# Patient Record
Sex: Male | Born: 1960
Health system: Southern US, Community
[De-identification: ages and names within clinical notes are randomized; demographics above are authoritative.]

## PROBLEM LIST (undated history)

## (undated) DIAGNOSIS — E119 Type 2 diabetes mellitus without complications: Secondary | ICD-10-CM

## (undated) DIAGNOSIS — M21372 Foot drop, left foot: Secondary | ICD-10-CM

## (undated) DIAGNOSIS — G4730 Sleep apnea, unspecified: Secondary | ICD-10-CM

## (undated) DIAGNOSIS — M549 Dorsalgia, unspecified: Secondary | ICD-10-CM

## (undated) DIAGNOSIS — I509 Heart failure, unspecified: Secondary | ICD-10-CM

## (undated) DIAGNOSIS — C801 Malignant (primary) neoplasm, unspecified: Secondary | ICD-10-CM

## (undated) DIAGNOSIS — E785 Hyperlipidemia, unspecified: Secondary | ICD-10-CM

## (undated) HISTORY — DX: Malignant (primary) neoplasm, unspecified: C80.1

## (undated) HISTORY — DX: Hyperlipidemia, unspecified: E78.5

## (undated) HISTORY — DX: Sleep apnea, unspecified: G47.30

---

## 2016-12-31 ENCOUNTER — Emergency Department: Payer: BLUE CROSS/BLUE SHIELD

## 2016-12-31 ENCOUNTER — Emergency Department
Admission: EM | Admit: 2016-12-31 | Discharge: 2016-12-31 | Disposition: A | Discharge status: Home / Self Care | Payer: BLUE CROSS/BLUE SHIELD | Attending: Emergency Medicine | Admitting: Emergency Medicine

## 2016-12-31 DIAGNOSIS — N2889 Other specified disorders of kidney and ureter: Secondary | ICD-10-CM

## 2016-12-31 DIAGNOSIS — M545 Low back pain: Secondary | ICD-10-CM | POA: Diagnosis present

## 2016-12-31 DIAGNOSIS — M5442 Lumbago with sciatica, left side: Secondary | ICD-10-CM | POA: Insufficient documentation

## 2016-12-31 DIAGNOSIS — Z79899 Other long term (current) drug therapy: Secondary | ICD-10-CM | POA: Diagnosis not present

## 2016-12-31 DIAGNOSIS — I509 Heart failure, unspecified: Secondary | ICD-10-CM | POA: Insufficient documentation

## 2016-12-31 DIAGNOSIS — G8929 Other chronic pain: Secondary | ICD-10-CM

## 2016-12-31 DIAGNOSIS — Z794 Long term (current) use of insulin: Secondary | ICD-10-CM | POA: Insufficient documentation

## 2016-12-31 DIAGNOSIS — E119 Type 2 diabetes mellitus without complications: Secondary | ICD-10-CM | POA: Diagnosis not present

## 2016-12-31 DIAGNOSIS — M5410 Radiculopathy, site unspecified: Secondary | ICD-10-CM | POA: Diagnosis not present

## 2016-12-31 HISTORY — DX: Heart failure, unspecified: I50.9

## 2016-12-31 HISTORY — DX: Dorsalgia, unspecified: M54.9

## 2016-12-31 HISTORY — DX: Type 2 diabetes mellitus without complications: E11.9

## 2016-12-31 HISTORY — DX: Foot drop, left foot: M21.372

## 2016-12-31 LAB — COMPREHENSIVE METABOLIC PANEL
ALK PHOS: 101 U/L (ref 38–126)
ALT: 42 U/L (ref 17–63)
AST: 30 U/L (ref 15–41)
Albumin: 4.5 g/dL (ref 3.5–5.0)
Anion gap: 10 (ref 5–15)
BUN: 18 mg/dL (ref 6–20)
CALCIUM: 9.8 mg/dL (ref 8.9–10.3)
CO2: 24 mmol/L (ref 22–32)
CREATININE: 0.95 mg/dL (ref 0.61–1.24)
Chloride: 98 mmol/L — ABNORMAL LOW (ref 101–111)
Glucose, Bld: 469 mg/dL — ABNORMAL HIGH (ref 65–99)
Potassium: 4.5 mmol/L (ref 3.5–5.1)
Sodium: 132 mmol/L — ABNORMAL LOW (ref 135–145)
TOTAL PROTEIN: 8.3 g/dL — AB (ref 6.5–8.1)
Total Bilirubin: 0.8 mg/dL (ref 0.3–1.2)

## 2016-12-31 LAB — CBC
HCT: 44.4 % (ref 40.0–52.0)
Hemoglobin: 15.3 g/dL (ref 13.0–18.0)
MCH: 29.6 pg (ref 26.0–34.0)
MCHC: 34.3 g/dL (ref 32.0–36.0)
MCV: 86.1 fL (ref 80.0–100.0)
PLATELETS: 204 10*3/uL (ref 150–440)
RBC: 5.16 MIL/uL (ref 4.40–5.90)
RDW: 13.1 % (ref 11.5–14.5)
WBC: 8.6 10*3/uL (ref 3.8–10.6)

## 2016-12-31 MED ORDER — OXYCODONE-ACETAMINOPHEN 5-325 MG PO TABS
1.0000 | ORAL_TABLET | Freq: Once | ORAL | Status: AC
  Administered 2016-12-31: 1 via ORAL
  Filled 2016-12-31: qty 1

## 2016-12-31 MED ORDER — IBUPROFEN 600 MG PO TABS: 600.0000 mg | ORAL_TABLET | Freq: Four times a day (QID) | ORAL | 0 refills | Status: DC | PRN

## 2016-12-31 MED ORDER — HYDROCODONE-ACETAMINOPHEN 5-325 MG PO TABS: 1.0000 | ORAL_TABLET | Freq: Four times a day (QID) | ORAL | 0 refills | Status: DC | PRN

## 2016-12-31 MED ORDER — KETOROLAC TROMETHAMINE 30 MG/ML IJ SOLN
30.0000 mg | Freq: Once | INTRAMUSCULAR | Status: AC
  Administered 2016-12-31: 30 mg via INTRAVENOUS
  Filled 2016-12-31: qty 1

## 2016-12-31 MED ORDER — GADOBENATE DIMEGLUMINE 529 MG/ML IV SOLN
20.0000 mL | Freq: Once | INTRAVENOUS | Status: AC | PRN
  Administered 2016-12-31: 20 mL via INTRAVENOUS
  Filled 2016-12-31: qty 20

## 2016-12-31 NOTE — ED Triage Notes (Signed)
Lower back pain to center and left side. Hx of same and drop foot to left side. Pt took Vicodin PTA

## 2016-12-31 NOTE — ED Notes (Signed)
See triage note  Presents with lower back pain  States he has had back surgeries in past and takes vicodin for pain.  Pain is moving into left leg /foot  States he foot drop to same foot  States now he is having  a hard time rasing his foot  Only able to lift great toe

## 2016-12-31 NOTE — ED Provider Notes (Signed)
[facility] Emergency Department Provider Note  ____________________________________________  Time seen: Approximately 12:01 PM  I have reviewed the triage vital signs and the nursing notes.   HISTORY  Chief Complaint Back Pain   HPI Juan Adkins is a 56 y.o. male that presents to the emergency department for evaluation of low back pain for 1 week. Patient states that he was lifting a heavy toolbox one week ago and thought he pulled a muscle.Pain has remained constant throughout the week. Patient has chronic back pain and has had 4 back surgeries. Last back surgery was 3 years ago. Patient has had chronic foot drop after the last surgery but is usually able to move his great toe. Last night he noticed that he was unable to move his toe and is having some numbness in that toe. His previous back surgeon is in [place] and he has not seen him since 2015. No shortness of breath, chest pain, nausea, vomiting, abdominal pain, bowel or bladder dysfunction, saddle paresthesias.   Past Medical History:  Diagnosis Date  . Back pain   . CHF (congestive heart failure) ([place])   . Diabetes mellitus without complication ([place])   . Foot drop, left foot     There are no active problems to display for this patient.   Past Surgical History:  Procedure Laterality Date  . BACK SURGERY      Prior to Admission medications   Medication Sig Start Date End Date Taking? Authorizing Provider  atorvastatin (LIPITOR) 80 MG tablet Take 80 mg by mouth daily.   Yes [provider]  carvedilol (COREG) 6.25 MG tablet Take 6.25 mg by mouth 2 (two) times daily with a meal.   Yes [provider]  digoxin (LANOXIN) 0.125 MG tablet Take by mouth daily.   Yes [provider]  furosemide (LASIX) 20 MG tablet Take 20 mg by mouth.   Yes [provider]  glimepiride (AMARYL) 4 MG tablet Take 8 mg by mouth daily with breakfast.   Yes [provider]   insulin glargine (LANTUS) 100 UNIT/ML injection Inject into the skin at bedtime.   Yes [provider]  metFORMIN (GLUCOPHAGE) 1000 MG tablet Take 1,000 mg by mouth 2 (two) times daily with a meal.   Yes [provider]  HYDROcodone-acetaminophen (NORCO/VICODIN) 5-325 MG tablet Take 1 tablet by mouth every 6 (six) hours as needed for moderate pain. 12/31/16   [name], PA-C  ibuprofen (ADVIL,MOTRIN) 600 MG tablet Take 1 tablet (600 mg total) by mouth every 6 (six) hours as needed. 12/31/16   [name], PA-C    Allergies Penicillins  No family history on file.  Social History Social History  Substance Use Topics  . Smoking status: Never Smoker  . Smokeless tobacco: Not on file  . Alcohol use No     Review of Systems  Constitutional: No fever/chills Cardiovascular: No chest pain. Respiratory: No SOB. Gastrointestinal: No abdominal pain.  No nausea, no vomiting.  Musculoskeletal: Positive for back pain. Skin: Negative for rash, abrasions, lacerations, ecchymosis. Neurological: Negative for headaches   ____________________________________________   PHYSICAL EXAM:  VITAL SIGNS: ED Triage Vitals [12/31/16 1052]  Enc Vitals Group     BP 105/65     Pulse Rate 94     Resp 18     Temp (!) 97.4 F (36.3 C)     Temp Source Oral     SpO2 98 %     Weight 210 lb (95.3 kg)  Height 5\' 9"  (1.753 m)     Head Circumference      Peak Flow      Pain Score 8     Pain Loc      Pain Edu?      Excl. in [place]?      Constitutional: Alert and oriented. Well appearing and in no acute distress. Eyes: Conjunctivae are normal. PERRL. EOMI. Head: Atraumatic. ENT:      Ears:      Nose: No congestion/rhinnorhea.      Mouth/Throat: Mucous membranes are moist.  Neck: No stridor.   Cardiovascular: Normal rate, regular rhythm.  Good peripheral circulation. Palpable dorsalis pedis pulses. Respiratory: Normal respiratory effort without tachypnea or retractions.  Lungs CTAB. Good air entry to the bases with no decreased or absent breath sounds. Gastrointestinal: Bowel sounds 4 quadrants. Soft and nontender to palpation. No guarding or rigidity. No palpable masses. No distention.  Musculoskeletal: Full range of motion to all extremities. No gross deformities appreciated. Positive straight leg raise. Sensation intact of lower legs intact bilaterally. Foot drop of left foot. Unable to move left great toe.  Neurologic:  Normal speech and language. No gross focal neurologic deficits are appreciated.  Skin:  Skin is warm, dry and intact. No rash noted.  ____________________________________________   LABS (all labs ordered are listed, but only abnormal results are displayed)  Labs Reviewed  COMPREHENSIVE METABOLIC PANEL - Abnormal; Notable for the following:       Result Value   Sodium 132 (*)    Chloride 98 (*)    Glucose, Bld 469 (*)    Total Protein 8.3 (*)    All other components within normal limits  CBC   ____________________________________________  EKG   ____________________________________________  RADIOLOGY  Mr Lumbar Spine W Wo Contrast  Result Date: 12/31/2016 CLINICAL DATA:  Lower back pain. Back surgeries in the past. Left leg foot drop. EXAM: MRI LUMBAR SPINE WITHOUT AND WITH CONTRAST TECHNIQUE: Multiplanar and multiecho pulse sequences of the lumbar spine were obtained without and with intravenous contrast. CONTRAST:  59mL MULTIHANCE GADOBENATE DIMEGLUMINE 529 MG/ML IV SOLN COMPARISON:  None. FINDINGS: Segmentation:  Standard Alignment:  Normal Vertebrae:  No fracture, evidence of discitis, or bone lesion. Conus medullaris: Extends to the L1 level and appears normal. There is fat deposition within the filum, small volume and incidental. Paraspinal and other soft tissues: 18 mm enhancing partly exophytic mass from the interpolar left kidney, usually renal cell carcinoma. Disc levels: T12- L1: Mild disc bulging, mainly left  far-lateral.  No impingement L1-L2: Mild annulus bulging.  No impingement L2-L3: Disc bulging and small up turning central disc protrusion. No impingement L3-L4: Degenerative disc narrowing and bulging with a left paracentral down turning disc protrusion. Ventral thecal sac is flattened by disc bulge and there is left more than right L4 impingement in the subarticular recesses. Patent foramina. L4-L5: Status post laminotomy on the right with patent right canal; probable microdiscectomy. There is disc bulging with a central down turning disc protrusion. The left L5 nerve root is compressed in the subarticular recess. Patent foramina. L5-S1:Left-sided foraminotomy and partial facetectomy. There is expected mild scarring in this area without stenosis. The disc shows advanced degenerative narrowing with disc bulging and endplate spurring. Left foraminal stenosis and L5 impingement. Right foramen is patent with mild narrowing due to facet spurs. IMPRESSION: 1. 18 mm left renal mass primarily suspicious for renal cell carcinoma. Recommend urology referral and dedicated renal protocol CT or MRI. 2. L3-4  disc bulge and left paracentral protrusion with left more than right L4 impingement in the subarticular recesses. 3. L4-5 left L5 impingement in the subarticular recess. The canal and right recesses are patent after remote laminotomy. 4. L5-S1 left foraminal L5 impingement due to disc narrowing and endplate spurring. Previous left sided laminotomy with patent spinal canal. Electronically Signed   By: Monte Fantasia M.D.   On: 12/31/2016 14:45    ____________________________________________    PROCEDURES  Procedure(s) performed:    Procedures    Medications  oxyCODONE-acetaminophen (PERCOCET/ROXICET) 5-325 MG per tablet 1 tablet (1 tablet Oral Given 12/31/16 1219)  gadobenate dimeglumine (MULTIHANCE) injection 20 mL (20 mLs Intravenous Contrast Given 12/31/16 1404)  ketorolac (TORADOL) 30 MG/ML injection 30  mg (30 mg Intravenous Given 12/31/16 1736)     ____________________________________________   INITIAL IMPRESSION / ASSESSMENT AND PLAN / ED COURSE  Pertinent labs & imaging results that were available during my care of the patient were reviewed by me and considered in my medical decision making (see chart for details).  Review of the [place] CSRS was performed in accordance of the [place] prior to dispensing any controlled drugs.   Patient presented to the emergency department for evaluation of acute on chronic back pain. Vital signs and exam are reassuring. Patient has chronic foot drop of left foot and MRI was ordered due to new weakness in left great toe. No bowel or bladder dysfunction or saddle paresthesias. Dr. Cari Caraway was consulted, review MRI results, and recommended that patient follow up in clinic with him this week. Patient is aware of mass on kidneys and has been followed by urology previously regarding this. I will refer him to a urologist here. I will not give patient steroids given his high blood sugar. Patient does not check blood sugar and was educated on risks of blood sugars remaining this high. He was given a toradol injection.  Patient will be discharged home with prescriptions for ibuprofen and a very short course of vicodin. Patient is to follow up with neurosurgery, urology, and PCP as directed. Patient is given ED precautions to return to the ED for any worsening or new symptoms.     ____________________________________________  FINAL CLINICAL IMPRESSION(S) / ED DIAGNOSES  Final diagnoses:  Radiculopathy, unspecified spinal region  Chronic left-sided low back pain with left-sided sciatica  Renal mass      NEW MEDICATIONS STARTED DURING THIS VISIT:  Discharge Medication List as of 12/31/2016  5:32 PM    START taking these medications   Details  HYDROcodone-acetaminophen (NORCO/VICODIN) 5-325 MG tablet Take 1 tablet by mouth every 6 (six) hours as needed for  moderate pain., Starting Thu 12/31/2016, Print    ibuprofen (ADVIL,MOTRIN) 600 MG tablet Take 1 tablet (600 mg total) by mouth every 6 (six) hours as needed., Starting Thu 12/31/2016, Print            This chart was dictated using voice recognition software/Dragon. Despite best efforts to proofread, errors can occur which can change the meaning. Any change was purely unintentional.    [name], PA-C 01/01/17 1024    Harvest Dark, MD 01/03/17 [phone]

## 2017-01-11 ENCOUNTER — Encounter: Payer: Self-pay | Admitting: Student in an Organized Health Care Education/Training Program

## 2017-01-11 ENCOUNTER — Ambulatory Visit
Admission: RE | Admit: 2017-01-11 | Discharge: 2017-01-11 | Disposition: A | Discharge status: Home / Self Care | Payer: BLUE CROSS/BLUE SHIELD | Source: Ambulatory Visit | Attending: Student in an Organized Health Care Education/Training Program | Admitting: Student in an Organized Health Care Education/Training Program

## 2017-01-11 ENCOUNTER — Ambulatory Visit (HOSPITAL_BASED_OUTPATIENT_CLINIC_OR_DEPARTMENT_OTHER): Payer: BLUE CROSS/BLUE SHIELD | Admitting: Student in an Organized Health Care Education/Training Program

## 2017-01-11 VITALS — BP 131/83 | HR 88 | Temp 98.0°F | Resp 15 | Ht 69.0 in | Wt 207.0 lb

## 2017-01-11 DIAGNOSIS — Z87891 Personal history of nicotine dependence: Secondary | ICD-10-CM | POA: Diagnosis not present

## 2017-01-11 DIAGNOSIS — Z79899 Other long term (current) drug therapy: Secondary | ICD-10-CM | POA: Diagnosis not present

## 2017-01-11 DIAGNOSIS — E119 Type 2 diabetes mellitus without complications: Secondary | ICD-10-CM | POA: Diagnosis not present

## 2017-01-11 DIAGNOSIS — Z880 Allergy status to penicillin: Secondary | ICD-10-CM | POA: Diagnosis not present

## 2017-01-11 DIAGNOSIS — M79605 Pain in left leg: Secondary | ICD-10-CM | POA: Insufficient documentation

## 2017-01-11 DIAGNOSIS — M545 Low back pain, unspecified: Secondary | ICD-10-CM

## 2017-01-11 DIAGNOSIS — M21372 Foot drop, left foot: Secondary | ICD-10-CM | POA: Diagnosis not present

## 2017-01-11 DIAGNOSIS — M549 Dorsalgia, unspecified: Secondary | ICD-10-CM | POA: Insufficient documentation

## 2017-01-11 DIAGNOSIS — M5136 Other intervertebral disc degeneration, lumbar region: Secondary | ICD-10-CM

## 2017-01-11 DIAGNOSIS — M5416 Radiculopathy, lumbar region: Secondary | ICD-10-CM | POA: Diagnosis not present

## 2017-01-11 DIAGNOSIS — G4730 Sleep apnea, unspecified: Secondary | ICD-10-CM | POA: Diagnosis not present

## 2017-01-11 DIAGNOSIS — M5116 Intervertebral disc disorders with radiculopathy, lumbar region: Secondary | ICD-10-CM | POA: Insufficient documentation

## 2017-01-11 DIAGNOSIS — Z794 Long term (current) use of insulin: Secondary | ICD-10-CM | POA: Diagnosis not present

## 2017-01-11 DIAGNOSIS — I509 Heart failure, unspecified: Secondary | ICD-10-CM | POA: Insufficient documentation

## 2017-01-11 MED ORDER — LIDOCAINE HCL (PF) 1 % IJ SOLN
INTRAMUSCULAR | Status: AC
  Filled 2017-01-11: qty 5

## 2017-01-11 MED ORDER — IOPAMIDOL (ISOVUE-M 200) INJECTION 41%
10.0000 mL | Freq: Once | INTRAMUSCULAR | Status: AC
  Administered 2017-01-11: 10 mL via EPIDURAL

## 2017-01-11 MED ORDER — DEXAMETHASONE SODIUM PHOSPHATE 10 MG/ML IJ SOLN
10.0000 mg | Freq: Once | INTRAMUSCULAR | Status: AC
  Administered 2017-01-11: 10 mg

## 2017-01-11 MED ORDER — LIDOCAINE HCL (PF) 1 % IJ SOLN
10.0000 mL | Freq: Once | INTRAMUSCULAR | Status: AC
  Administered 2017-01-11: 5 mL

## 2017-01-11 MED ORDER — ROPIVACAINE HCL 2 MG/ML IJ SOLN
10.0000 mL | Freq: Once | INTRAMUSCULAR | Status: AC
  Administered 2017-01-11: 10 mL

## 2017-01-11 MED ORDER — DEXAMETHASONE SODIUM PHOSPHATE 10 MG/ML IJ SOLN
INTRAMUSCULAR | Status: AC
  Filled 2017-01-11: qty 1

## 2017-01-11 MED ORDER — ROPIVACAINE HCL 2 MG/ML IJ SOLN
INTRAMUSCULAR | Status: AC
  Filled 2017-01-11: qty 10

## 2017-01-11 MED ORDER — SODIUM CHLORIDE 0.9% FLUSH
1.0000 mL | Freq: Once | INTRAVENOUS | Status: AC
  Administered 2017-01-11: 10 mL

## 2017-01-11 MED ORDER — IOPAMIDOL (ISOVUE-M 200) INJECTION 41%
INTRAMUSCULAR | Status: AC
  Filled 2017-01-11: qty 10

## 2017-01-11 MED ORDER — SODIUM CHLORIDE 0.9 % IJ SOLN
INTRAMUSCULAR | Status: AC
  Filled 2017-01-11: qty 10

## 2017-01-11 NOTE — Progress Notes (Signed)
Patient's Name: Juan Adkins  MRN: [id number]  Referring Provider: Meade Maw, MD  DOB: 09/13/1960  PCP: System, Pcp Not In  DOS: 01/11/2017  Note by: Gillis Santa, MD  Service setting: Ambulatory outpatient  Specialty: Interventional Pain Management  Location: [facility] (AMB) Pain Management Facility    Patient type: New patient ("FAST-TRACK" Evaluation)   Warning: This referral option does not include the extensive pharmacological evaluation required for [place] to take over the patient's medication management. The "Fast-Track" system is designed to bypass the new patient referral waiting list, as well as the normal patient evaluation process, in order to provide a patient in distress with a timely pain management intervention. Because the system was not designed to unfairly get a patient into our pain practice ahead of those already waiting, certain restrictions apply. By requesting a "Fast-Track" consult, the referring physician has opted to continue managing the patient's medications in order to get interventional urgent care.  Primary Reason for Visit: Interventional Pain Management Treatment. CC: Back Pain (low radiates to left buttock) and Leg Pain (left groin, leg pain is posterior)   Procedure  HPI  Mr. Laakso is a 56 y.o. year old, male patient, who comes today for a  "Fast-Track" new patient evaluation, as requested by Meade Maw, MD. The patient has been made aware that this type of referral option is reserved for the Interventional Pain Management portion of our practice and completely excludes the option of medication management. His primarily concern today is the Back Pain (low radiates to left buttock) and Leg Pain (left groin, leg pain is posterior)  Pain Assessment: Location: Lower Back Radiating: left buttock, left leg, let groin Onset: 1 to 4 weeks ago Duration: Acute pain Quality: Stabbing, Dull, Aching Severity: 7 /10 (self-reported pain score)  Note: Reported  level is compatible with observation.                   When using our objective Pain Scale, any level above a 5/10 is said to belong in an emergency room, as it progressively worsens from a 6/10, which is described as severely limiting. Requiring emergency care not usually available at an outpatient pain management facility. Communication becomes difficult and requires great effort. Assistance to reach the emergency department may be required. Facial flushing and profuse sweating along with potentially dangerous increases in heart rate and blood pressure will be evident. Effect on ADL:   Timing: Constant Modifying factors: lying flat, positioning, medications, ice, heat  Onset and Duration: Sudden and Date of onset: 12/28/2016 Cause of pain: Unknown Severity: Getting worse Timing: During activity or exercise and After a period of immobility Aggravating Factors: Bending, Bowel movements, Kneeling, Lifiting, Prolonged sitting, Squatting, Stooping , Twisting and Walking uphill Alleviating Factors: Stretching, Cold packs, Hot packs, Lying down, Medications, Sleeping and Walking Associated Problems: Constipation, Night-time cramps, Numbness, Sweating, Tingling, Weakness, Pain that wakes patient up and Pain that does not allow patient to sleep Quality of Pain: Aching, Agonizing, Annoying, Intermittent, Deep, Disabling, Dreadful, Dull, Horrible, Nagging, Punishing, Sharp, Shooting, Stabbing, Throbbing, Tingling and Uncomfortable Previous Examinations or Tests: MRI scan Previous Treatments: Narcotic medications  The patient comes into the clinics today, referred to [place] for a lumbar epidural steroid injection secondary to lumbar radiculopathy. Patient has a history of a left sided L3, L4, L5 laminotomy many years ago. He was moving heavy boxes last week and noticed worsening low back pain with radiation to left leg. Lumbar MRI performed on September 13 showed L3/L4 disc bulge  with left paracentral protrusion,  left L4-L5 disc bulge with L5 impingement/L5/S1 left foraminal L5 impingement.  Had a discussion about her transforaminal versus caudal epidural steroid injection. Givens that patient's pain is multilevel and with his prior history of laminotomy, we'll start off with caudal epidural steroid injection. If this is not effective, we can consider repeating with a transforaminal epidural steroid injection. Patient in agreement with plan. Risks and benefits of procedure discussed. Patient not on any blood thinners.  Meds   Current Outpatient Prescriptions:  .  atorvastatin (LIPITOR) 80 MG tablet, Take 80 mg by mouth daily., Disp: , Rfl:  .  carvedilol (COREG) 6.25 MG tablet, Take 6.25 mg by mouth 2 (two) times daily with a meal., Disp: , Rfl:  .  digoxin (LANOXIN) 0.125 MG tablet, Take by mouth daily., Disp: , Rfl:  .  furosemide (LASIX) 20 MG tablet, Take 20 mg by mouth., Disp: , Rfl:  .  glimepiride (AMARYL) 4 MG tablet, Take 8 mg by mouth daily with breakfast., Disp: , Rfl:  .  HYDROcodone-acetaminophen (NORCO/VICODIN) 5-325 MG tablet, Take 1 tablet by mouth every 6 (six) hours as needed for moderate pain., Disp: 4 tablet, Rfl: 0 .  ibuprofen (ADVIL,MOTRIN) 600 MG tablet, Take 1 tablet (600 mg total) by mouth every 6 (six) hours as needed., Disp: 30 tablet, Rfl: 0 .  insulin glargine (LANTUS) 100 UNIT/ML injection, Inject into the skin at bedtime., Disp: , Rfl:  .  lisinopril (PRINIVIL,ZESTRIL) 20 MG tablet, Take 20 mg by mouth daily., Disp: , Rfl:  .  metFORMIN (GLUCOPHAGE) 1000 MG tablet, Take 1,000 mg by mouth 2 (two) times daily with a meal., Disp: , Rfl:  .  vitamin A 7500 UNIT capsule, Take 7,500 Units by mouth daily., Disp: , Rfl:   Imaging Review    Shoulder Imaging Results for orders placed during the hospital encounter of 12/31/16  MR Lumbar Spine W Wo Contrast   Narrative CLINICAL DATA:  Lower back pain. Back surgeries in the past. Left leg foot drop.  EXAM: MRI LUMBAR SPINE  WITHOUT AND WITH CONTRAST  TECHNIQUE: Multiplanar and multiecho pulse sequences of the lumbar spine were obtained without and with intravenous contrast.  CONTRAST:  88m MULTIHANCE GADOBENATE DIMEGLUMINE 529 MG/ML IV SOLN  COMPARISON:  None.  FINDINGS: Segmentation:  Standard  Alignment:  Normal  Vertebrae:  No fracture, evidence of discitis, or bone lesion.  Conus medullaris: Extends to the L1 level and appears normal. There is fat deposition within the filum, small volume and incidental.  Paraspinal and other soft tissues: 18 mm enhancing partly exophytic mass from the interpolar left kidney, usually renal cell carcinoma.  Disc levels:  T12- L1: Mild disc bulging, mainly left far-lateral.  No impingement  L1-L2: Mild annulus bulging.  No impingement  L2-L3: Disc bulging and small up turning central disc protrusion. No impingement  L3-L4: Degenerative disc narrowing and bulging with a left paracentral down turning disc protrusion. Ventral thecal sac is flattened by disc bulge and there is left more than right L4 impingement in the subarticular recesses. Patent foramina.  L4-L5: Status post laminotomy on the right with patent right canal; probable microdiscectomy. There is disc bulging with a central down turning disc protrusion. The left L5 nerve root is compressed in the subarticular recess. Patent foramina.  L5-S1:Left-sided foraminotomy and partial facetectomy. There is expected mild scarring in this area without stenosis. The disc shows advanced degenerative narrowing with disc bulging and endplate spurring. Left foraminal stenosis and L5  impingement. Right foramen is patent with mild narrowing due to facet spurs.  IMPRESSION: 1. 18 mm left renal mass primarily suspicious for renal cell carcinoma. Recommend urology referral and dedicated renal protocol CT or MRI. 2. L3-4 disc bulge and left paracentral protrusion with left more than right L4 impingement in  the subarticular recesses. 3. L4-5 left L5 impingement in the subarticular recess. The canal and right recesses are patent after remote laminotomy. 4. L5-S1 left foraminal L5 impingement due to disc narrowing and endplate spurring. Previous left sided laminotomy with patent spinal canal.   Electronically Signed   By: Monte Fantasia M.D.   On: 12/31/2016 14:45       Complexity Note: Imaging results reviewed. Results discussed using Layman's terms.               ROS  Cardiovascular History: Heart failure Pulmonary or Respiratory History: Snoring  Neurological History: No reported neurological signs or symptoms such as seizures, abnormal skin sensations, urinary and/or fecal incontinence, being born with an abnormal open spine and/or a tethered spinal cord Review of Past Neurological Studies: No results found for this or any previous visit. Psychological-Psychiatric History: No reported psychological or psychiatric signs or symptoms such as difficulty sleeping, anxiety, depression, delusions or hallucinations (schizophrenial), mood swings (bipolar disorders) or suicidal ideations or attempts Gastrointestinal History: No reported gastrointestinal signs or symptoms such as vomiting or evacuating blood, reflux, heartburn, alternating episodes of diarrhea and constipation, inflamed or scarred liver, or pancreas or irrregular and/or infrequent bowel movements Genitourinary History: No reported renal or genitourinary signs or symptoms such as difficulty voiding or producing urine, peeing blood, non-functioning kidney, kidney stones, difficulty emptying the bladder, difficulty controlling the flow of urine, or chronic kidney disease Hematological History: Weakness due to low blood hemoglobin or red blood cell count (Anemia) Endocrine History: High blood sugar requiring insulin (IDDM) Rheumatologic History: No reported rheumatological signs and symptoms such as fatigue, joint pain, tenderness,  swelling, redness, heat, stiffness, decreased range of motion, with or without associated rash Musculoskeletal History: Negative for myasthenia gravis, muscular dystrophy, multiple sclerosis or malignant hyperthermia Work History: Working full time  Allergies  Mr. Wynder is allergic to penicillins.  Laboratory Chemistry  Inflammation Markers (CRP: Acute Phase) (ESR: Chronic Phase) No results found for: CRP, ESRSEDRATE               Renal Function Markers Lab Results  Component Value Date   BUN 18 12/31/2016   CREATININE 0.95 12/31/2016   GFRAA >60 12/31/2016   GFRNONAA >60 12/31/2016                 Hepatic Function Markers Lab Results  Component Value Date   AST 30 12/31/2016   ALT 42 12/31/2016   ALBUMIN 4.5 12/31/2016   ALKPHOS 101 12/31/2016                 Electrolytes Lab Results  Component Value Date   NA 132 (L) 12/31/2016   K 4.5 12/31/2016   CL 98 (L) 12/31/2016   CALCIUM 9.8 12/31/2016                 Neuropathy Markers No results found for: [id number]               Bone Pathology Markers Lab Results  Component Value Date   ALKPHOS 101 12/31/2016   CALCIUM 9.8 12/31/2016                 Coagulation Parameters  Lab Results  Component Value Date   PLT 204 12/31/2016                 Cardiovascular Markers Lab Results  Component Value Date   HGB 15.3 12/31/2016   HCT 44.4 12/31/2016                 Note: Lab results reviewed.  [place]  Drug: Mr. Leeth  reports that he does not use drugs. Alcohol:  reports that he does not drink alcohol. Tobacco:  reports that he quit smoking about 3 years ago. His smoking use included Cigarettes. He has a 30.00 pack-year smoking history. He has never used smokeless tobacco. Medical:  has a past medical history of Back pain; CHF (congestive heart failure) ([place]); Diabetes mellitus without complication ([place]); Foot drop, left foot; and Sleep apnea. Family: family history includes Dementia in his mother;  Diabetes in his brother and sister; Early death in his paternal aunt and paternal uncle; Heart disease in his father.  Past Surgical History:  Procedure Laterality Date  . BACK SURGERY     Active Ambulatory Problems    Diagnosis Date Noted  . No Active Ambulatory Problems   Resolved Ambulatory Problems    Diagnosis Date Noted  . No Resolved Ambulatory Problems   Past Medical History:  Diagnosis Date  . Back pain   . CHF (congestive heart failure) ([place])   . Diabetes mellitus without complication ([place])   . Foot drop, left foot   . Sleep apnea    Constitutional Exam  General appearance: Well nourished, well developed, and well hydrated. In no apparent acute distress Vitals:   01/11/17 1058 01/11/17 1103 01/11/17 1108 01/11/17 1113  BP: 118/79 115/76 108/70 131/83  Pulse: 79 75 75 88  Resp: 13 15 15 15   Temp:      TempSrc:      SpO2: 97% 96% 97% 99%  Weight:      Height:       BMI Assessment: Estimated body mass index is 30.57 kg/m as calculated from the following:   Height as of this encounter: 5' 9"  (1.753 m).   Weight as of this encounter: 207 lb (93.9 kg).  BMI interpretation table: BMI level Category Range association with higher incidence of chronic pain  <18 kg/m2 Underweight   18.5-24.9 kg/m2 Ideal body weight   25-29.9 kg/m2 Overweight Increased incidence by 20%  30-34.9 kg/m2 Obese (Class I) Increased incidence by 68%  35-39.9 kg/m2 Severe obesity (Class II) Increased incidence by 136%  >40 kg/m2 Extreme obesity (Class III) Increased incidence by 254%   BMI Readings from Last 4 Encounters:  01/11/17 30.57 kg/m  12/31/16 31.01 kg/m   Wt Readings from Last 4 Encounters:  01/11/17 207 lb (93.9 kg)  12/31/16 210 lb (95.3 kg)  Psych/Mental status: Alert, oriented x 3 (person, place, & time)       Eyes: PERLA Respiratory: No evidence of acute respiratory distress  Lumbar Spine Area Exam  Skin & Axial Inspection: Well healed scar from previous spine  surgery detected Alignment: Symmetrical Functional ROM: Decreased ROM      Stability: No instability detected Muscle Tone/Strength: Functionally intact. No obvious neuro-muscular anomalies detected. Sensory (Neurological): Unimpaired Palpation: No palpable anomalies       Provocative Tests: Lumbar Hyperextension and rotation test: Positive bilaterally for facet joint pain. Lumbar Lateral bending test: Positive ipsilateral radicular pain, on the left. Positive for left-sided foraminal stenosis. Patrick's Maneuver: evaluation deferred today  Gait & Posture Assessment  Ambulation: Patient ambulates using a cane Gait: Antalgic Posture: WNL   Lower Extremity Exam    Side: Right lower extremity  Side: Left lower extremity  Skin & Extremity Inspection: Skin color, temperature, and hair growth are WNL. No peripheral edema or cyanosis. No masses, redness, swelling, asymmetry, or associated skin lesions. No contractures.  Skin & Extremity Inspection: Skin color, temperature, and hair growth are WNL. No peripheral edema or cyanosis. No masses, redness, swelling, asymmetry, or associated skin lesions. No contractures.  Functional ROM: Decreased ROM          Functional ROM: Decreased ROM          Muscle Tone/Strength: Functionally intact. No obvious neuro-muscular anomalies detected.  Muscle Tone/Strength: 4 out of 5 strength plantar flexion, dorsiflexion, knee flexion, knee extension.  Sensory (Neurological): Dermatomal pain pattern  Sensory (Neurological): Dermatomal pain pattern  Palpation: No palpable anomalies  Palpation: No palpable anomalies   +SLR test on left  Procedure  56 year old male with a past medical history of left L3-L5 laminotomy who presents with acute on chronic lumbosacral radiculopathy here for caudal epidural steroid injection. Positive straight leg raise test on left. Patient not on any blood thinners.

## 2017-01-11 NOTE — Progress Notes (Signed)
Safety precautions to be maintained throughout the outpatient stay will include: orient to surroundings, keep bed in low position, maintain call bell within reach at all times, provide assistance with transfer out of bed and ambulation.  

## 2017-01-11 NOTE — Progress Notes (Signed)
Patient's Name: Jamin Panther  MRN: [id number]  Referring Provider: Meade Maw, MD  DOB: October 25, 1960  PCP: System, Pcp Not In  DOS: 01/11/2017  Note by: Gillis Santa, MD  Service setting: Ambulatory outpatient  Specialty: Interventional Pain Management  Patient type: Established  Location: [facility] (AMB) Pain Management Facility  Visit type: Interventional Procedure   Primary Reason for Visit: Interventional Pain Management Treatment. CC: Back Pain (low radiates to left buttock) and Leg Pain (left groin, leg pain is posterior)  Procedure:  Anesthesia, Analgesia, Anxiolysis:  Type: Diagnostic Epidural Steroid Injection Region: Caudal Level: Sacrococcygeal   Laterality: Midline aiming at the left  Type: Local Anesthesia Local Anesthetic: Lidocaine 1% Route: Infiltration (Almond/IM) IV Access: Secured Sedation: Meaningful verbal contact was maintained at all times during the procedure  Indication(s): Analgesia and Anxiety  Indications: 1. Lumbar radiculopathy   2. Lumbar degenerative disc disease   3. Lumbar pain    Pain Score: Pre-procedure: 7 /10 Post-procedure: 0-No pain/10  Pre-op Assessment:  Mr. Roser is a 56 y.o. (year old), male patient, seen today for interventional treatment. He  has a past surgical history that includes Back surgery. Mr. Crum has a current medication list which includes the following prescription(s): atorvastatin, carvedilol, digoxin, furosemide, glimepiride, hydrocodone-acetaminophen, ibuprofen, insulin glargine, lisinopril, metformin, and vitamin a. His primarily concern today is the Back Pain (low radiates to left buttock) and Leg Pain (left groin, leg pain is posterior)  Initial Vital Signs: There were no vitals taken for this visit. BMI: Estimated body mass index is 30.57 kg/m as calculated from the following:   Height as of this encounter: 5\' 9"  (1.753 m).   Weight as of this encounter: 207 lb (93.9 kg).  Risk Assessment: Allergies: Reviewed.  He is allergic to penicillins.  Allergy Precautions: None required Coagulopathies: Reviewed. None identified.  Blood-thinner therapy: None at this time Active Infection(s): Reviewed. None identified. Mr. Reish is afebrile  Site Confirmation: Mr. Yandow was asked to confirm the procedure and laterality before marking the site Procedure checklist: Completed Consent: Before the procedure and under the influence of no sedative(s), amnesic(s), or anxiolytics, the patient was informed of the treatment options, risks and possible complications. To fulfill our ethical and legal obligations, as recommended by the American Medical Association's Code of Ethics, I have informed the patient of my clinical impression; the nature and purpose of the treatment or procedure; the risks, benefits, and possible complications of the intervention; the alternatives, including doing nothing; the risk(s) and benefit(s) of the alternative treatment(s) or procedure(s); and the risk(s) and benefit(s) of doing nothing. The patient was provided information about the general risks and possible complications associated with the procedure. These may include, but are not limited to: failure to achieve desired goals, infection, bleeding, organ or nerve damage, allergic reactions, paralysis, and death. In addition, the patient was informed of those risks and complications associated to Spine-related procedures, such as failure to decrease pain; infection (i.e.: Meningitis, epidural or intraspinal abscess); bleeding (i.e.: epidural hematoma, subarachnoid hemorrhage, or any other type of intraspinal or peri-dural bleeding); organ or nerve damage (i.e.: Any type of peripheral nerve, nerve root, or spinal cord injury) with subsequent damage to sensory, motor, and/or autonomic systems, resulting in permanent pain, numbness, and/or weakness of one or several areas of the body; allergic reactions; (i.e.: anaphylactic reaction); and/or  death. Furthermore, the patient was informed of those risks and complications associated with the medications. These include, but are not limited to: allergic reactions (i.e.: anaphylactic or anaphylactoid reaction(s));  adrenal axis suppression; blood sugar elevation that in diabetics may result in ketoacidosis or comma; water retention that in patients with history of congestive heart failure may result in shortness of breath, pulmonary edema, and decompensation with resultant heart failure; weight gain; swelling or edema; medication-induced neural toxicity; particulate matter embolism and blood vessel occlusion with resultant organ, and/or nervous system infarction; and/or aseptic necrosis of one or more joints. Finally, the patient was informed that Medicine is not an exact science; therefore, there is also the possibility of unforeseen or unpredictable risks and/or possible complications that may result in a catastrophic outcome. The patient indicated having understood very clearly. We have given the patient no guarantees and we have made no promises. Enough time was given to the patient to ask questions, all of which were answered to the patient's satisfaction. Mr. Jepsen has indicated that he wanted to continue with the procedure. Attestation: I, the ordering provider, attest that I have discussed with the patient the benefits, risks, side-effects, alternatives, likelihood of achieving goals, and potential problems during recovery for the procedure that I have provided informed consent. Date: 01/11/2017; Time: 10:03 AM  Pre-Procedure Preparation:  Monitoring: As per clinic protocol. Respiration, ETCO2, SpO2, BP, heart rate and rhythm monitor placed and checked for adequate function Safety Precautions: Patient was assessed for positional comfort and pressure points before starting the procedure. Time-out: I initiated and conducted the "Time-out" before starting the procedure, as per protocol. The  patient was asked to participate by confirming the accuracy of the "Time Out" information. Verification of the correct person, site, and procedure were performed and confirmed by me, the nursing staff, and the patient. "Time-out" conducted as per Joint Commission's Universal Protocol (UP.01.01.01). "Time-out" Date & Time: 01/11/2017; 1055 hrs.  Description of Procedure Process:   Position: Prone Target Area: Caudal Epidural Canal. Approach: Midline approach. Area Prepped: Entire Posterior Sacrococcygeal Region Prepping solution: ChloraPrep (2% chlorhexidine gluconate and 70% isopropyl alcohol) Safety Precautions: Aspiration looking for blood return was conducted prior to all injections. At no point did we inject any substances, as a needle was being advanced. No attempts were made at seeking any paresthesias. Safe injection practices and needle disposal techniques used. Medications properly checked for expiration dates. SDV (single dose vial) medications used. Description of the Procedure: Protocol guidelines were followed. The patient was placed in position over the fluoroscopy table. The target area was identified and the area prepped in the usual manner. Skin desensitized using vapocoolant spray. Skin & deeper tissues infiltrated with local anesthetic. Appropriate amount of time allowed to pass for local anesthetics to take effect. The procedure needles were then advanced to the target area. Proper needle placement secured. Negative aspiration confirmed. Solution injected in intermittent fashion, asking for systemic symptoms every 0.5cc of injectate. The needles were then removed and the area cleansed, making sure to leave some of the prepping solution back to take advantage of its long term bactericidal properties. Vitals:   01/11/17 1058 01/11/17 1103 01/11/17 1108 01/11/17 1113  BP: 118/79 115/76 108/70 131/83  Pulse: 79 75 75 88  Resp: 13 15 15 15   Temp:      TempSrc:      SpO2: 97% 96% 97%  99%  Weight:      Height:        Start Time: 1056 hrs. End Time: 1110 hrs. Materials:  Needle(s) Type: Epidural needle Gauge: 17G Length: 3.5-in Medication(s): We administered lidocaine (PF), ropivacaine (PF) 2 mg/mL (0.2%), iopamidol, dexamethasone, and sodium chloride flush.  Please see chart orders for dosing details. 6 cc of normal saline, 2 cc of 0.2% ropivacaine, 1 cc of Decadron equals 9 cc total Imaging Guidance (Spinal):  Type of Imaging Technique: Fluoroscopy Guidance (Spinal) Indication(s): Assistance in needle guidance and placement for procedures requiring needle placement in or near specific anatomical locations not easily accessible without such assistance. Exposure Time: Please see nurses notes. Contrast: Before injecting any contrast, we confirmed that the patient did not have an allergy to iodine, shellfish, or radiological contrast. Once satisfactory needle placement was completed at the desired level, radiological contrast was injected. Contrast injected under live fluoroscopy. No contrast complications. See chart for type and volume of contrast used. Fluoroscopic Guidance: I was personally present during the use of fluoroscopy. "Tunnel Vision Technique" used to obtain the best possible view of the target area. Parallax error corrected before commencing the procedure. "Direction-depth-direction" technique used to introduce the needle under continuous pulsed fluoroscopy. Once target was reached, antero-posterior, oblique, and lateral fluoroscopic projection used confirm needle placement in all planes. Images permanently stored in EMR. Interpretation: I personally interpreted the imaging intraoperatively. Adequate needle placement confirmed in multiple planes. Appropriate spread of contrast into desired area was observed. No evidence of afferent or efferent intravascular uptake. No intrathecal or subarachnoid spread observed. Permanent images saved into the patient's  record.  Antibiotic Prophylaxis:  Indication(s): None identified Antibiotic given: None  Post-operative Assessment:  EBL: None Complications: No immediate post-treatment complications observed by team, or reported by patient. Note: The patient tolerated the entire procedure well. A repeat set of vitals were taken after the procedure and the patient was kept under observation following institutional policy, for this type of procedure. Post-procedural neurological assessment was performed, showing return to baseline, prior to discharge. The patient was provided with post-procedure discharge instructions, including a section on how to identify potential problems. Should any problems arise concerning this procedure, the patient was given instructions to immediately contact us, at any time, without hesitation. In any case, we plan to contact the patient by telephone for a follow-up status report regarding this interventional procedure. Comments:  No additional relevant information.  Plan of Care  Disposition: Discharge home  Discharge Date & Time: 01/11/2017; 1115 hrs.   Physical exam unchanged from prior. Patient noticing pain relief of left leg pain. Follow-up in 2 weeks.  Can repeat caudal ESI at that time or consider left L3-L4/L4-L5 transforaminal ESI  Physician-requested Follow-up:  Return in about 2 weeks (around 01/25/2017) for Post Procedure Evaluation.  Future Appointments Date Time Provider [place]  01/26/2017 9:45 AM Gillis Santa, MD [facility] None    Imaging Orders     DG C-Arm 1-60 Min-No Report Procedure Orders    No procedure(s) ordered today    Medications ordered for procedure: Meds ordered this encounter  Medications  . lidocaine (PF) (XYLOCAINE) 1 % injection 10 mL  . ropivacaine (PF) 2 mg/mL (0.2%) (NAROPIN) injection 10 mL  . iopamidol (ISOVUE-M) 41 % intrathecal injection 10 mL  . dexamethasone (DECADRON) injection 10 mg  . sodium chloride flush (NS)  0.9 % injection 1 mL   Medications administered: We administered lidocaine (PF), ropivacaine (PF) 2 mg/mL (0.2%), iopamidol, dexamethasone, and sodium chloride flush.  See the medical record for exact dosing, route, and time of administration.  New Prescriptions   No medications on file   Primary Care Physician: System, Pcp Not In Location: [facility] Note by: Gillis Santa, MD Date: 01/11/2017; Time: 12:11 PM  Disclaimer:  Medicine is not an Chief Strategy Officer. The only guarantee in medicine is that nothing is guaranteed. It is important to note that the decision to proceed with this intervention was based on the information collected from the patient. The Data and conclusions were drawn from the patient's questionnaire, the interview, and the physical examination. Because the information was provided in large part by the patient, it cannot be guaranteed that it has not been purposely or unconsciously manipulated. Every effort has been made to obtain as much relevant data as possible for this evaluation. It is important to note that the conclusions that lead to this procedure are derived in large part from the available data. Always take into account that the treatment will also be dependent on availability of resources and existing treatment guidelines, considered by other Pain Management Practitioners as being common knowledge and practice, at the time of the intervention. For Medico-Legal purposes, it is also important to point out that variation in procedural techniques and pharmacological choices are the acceptable norm. The indications, contraindications, technique, and results of the above procedure should only be interpreted and judged by a Board-Certified Interventional Pain Specialist with extensive familiarity and expertise in the same exact procedure and technique.

## 2017-01-11 NOTE — Patient Instructions (Signed)

## 2017-01-12 ENCOUNTER — Telehealth: Payer: Self-pay | Admitting: *Deleted

## 2017-01-12 NOTE — Telephone Encounter (Signed)
No problems post procedure. 

## 2017-01-18 DIAGNOSIS — I420 Dilated cardiomyopathy: Secondary | ICD-10-CM | POA: Insufficient documentation

## 2017-01-26 ENCOUNTER — Ambulatory Visit
Payer: 59 | Attending: Student in an Organized Health Care Education/Training Program | Admitting: Student in an Organized Health Care Education/Training Program

## 2017-01-26 ENCOUNTER — Encounter: Payer: Self-pay | Admitting: Student in an Organized Health Care Education/Training Program

## 2017-01-26 VITALS — BP 126/74 | HR 91 | Temp 98.0°F | Resp 16 | Ht 69.0 in | Wt 207.0 lb

## 2017-01-26 DIAGNOSIS — M5136 Other intervertebral disc degeneration, lumbar region: Secondary | ICD-10-CM

## 2017-01-26 DIAGNOSIS — G8929 Other chronic pain: Secondary | ICD-10-CM | POA: Insufficient documentation

## 2017-01-26 DIAGNOSIS — Z79899 Other long term (current) drug therapy: Secondary | ICD-10-CM | POA: Diagnosis not present

## 2017-01-26 DIAGNOSIS — G4730 Sleep apnea, unspecified: Secondary | ICD-10-CM | POA: Diagnosis not present

## 2017-01-26 DIAGNOSIS — M5116 Intervertebral disc disorders with radiculopathy, lumbar region: Secondary | ICD-10-CM | POA: Diagnosis not present

## 2017-01-26 DIAGNOSIS — M545 Low back pain, unspecified: Secondary | ICD-10-CM

## 2017-01-26 DIAGNOSIS — M961 Postlaminectomy syndrome, not elsewhere classified: Secondary | ICD-10-CM

## 2017-01-26 DIAGNOSIS — M21372 Foot drop, left foot: Secondary | ICD-10-CM | POA: Diagnosis not present

## 2017-01-26 DIAGNOSIS — Z794 Long term (current) use of insulin: Secondary | ICD-10-CM | POA: Insufficient documentation

## 2017-01-26 DIAGNOSIS — Z880 Allergy status to penicillin: Secondary | ICD-10-CM | POA: Diagnosis not present

## 2017-01-26 DIAGNOSIS — I509 Heart failure, unspecified: Secondary | ICD-10-CM | POA: Insufficient documentation

## 2017-01-26 DIAGNOSIS — M5416 Radiculopathy, lumbar region: Secondary | ICD-10-CM

## 2017-01-26 DIAGNOSIS — E119 Type 2 diabetes mellitus without complications: Secondary | ICD-10-CM | POA: Insufficient documentation

## 2017-01-26 DIAGNOSIS — K6289 Other specified diseases of anus and rectum: Secondary | ICD-10-CM | POA: Insufficient documentation

## 2017-01-26 DIAGNOSIS — Z87891 Personal history of nicotine dependence: Secondary | ICD-10-CM | POA: Insufficient documentation

## 2017-01-26 NOTE — Patient Instructions (Signed)
-  Repeat caudal epidural steroid injection. -Please review informational materials about spinal cord stimulation. Please bring questions with you to next visit. If you would like to proceed with spinal cord stimulation, we will have to obtain thoracic MRI as well as psychological evaluation.

## 2017-01-26 NOTE — Progress Notes (Signed)
Patient's Name: Juan Adkins  MRN: [id number]  Referring Provider: No ref. provider found  DOB: 12-23-1960  PCP: System, Pcp Not In  DOS: 01/26/2017  Note by: Gillis Santa, MD  Service setting: Ambulatory outpatient  Specialty: Interventional Pain Management  Location: [facility] (AMB) Pain Management Facility    Patient type: Established   Primary Reason(s) for Visit: Encounter for post-procedure evaluation of chronic illness with mild to moderate exacerbation CC: Rectal Pain (left buttock)  HPI  Juan Adkins is a 56 y.o. year old, male patient, who comes today for a post-procedure evaluation. He  does not have a problem list on file. His primarily concern today is the Rectal Pain (left buttock)  Pain Assessment: Location: Left Buttocks Radiating: radiates into left groin and left leg Onset: More than a month ago Duration: Chronic pain Quality: Stabbing, Aching, Dull Severity: 6 /10 (self-reported pain score)  Note: Reported level is compatible with observation.                   When using our objective Pain Scale, levels between 6 and 10/10 are said to belong in an emergency room, as it progressively worsens from a 6/10, described as severely limiting, requiring emergency care not usually available at an outpatient pain management facility. At a 6/10 level, communication becomes difficult and requires great effort. Assistance to reach the emergency department may be required. Facial flushing and profuse sweating along with potentially dangerous increases in heart rate and blood pressure will be evident. Effect on ADL:   Timing: Intermittent Modifying factors: walking, standing  Juan Adkins comes in today for post-procedure evaluation after the treatment done on 01/11/2017.  56 year old male with a past medical history of left L3-L5 laminotomy who presents with acute on chronic lumbosacral radiculopathy s/p caudal ESI on 01/11/17 who presents for follow up.  Further details on both, my  assessment(s), as well as the proposed treatment plan, please see below.  Post-Procedure Assessment  01/11/2017 Procedure: left caudal ESI 01/11/17 Pre-procedure pain score:  7/10 Post-procedure pain score: 0/10         Influential Factors: BMI: 30.57 kg/m Intra-procedural challenges: None observed.         Assessment challenges: None detected.              Reported side-effects: None.        Post-procedural adverse reactions or complications: None reported         Sedation: Please see nurses note. When no sedatives are used, the analgesic levels obtained are directly associated to the effectiveness of the local anesthetics. However, when sedation is provided, the level of analgesia obtained during the initial 1 hour following the intervention, is believed to be the result of a combination of factors. These factors may include, but are not limited to: 1. The effectiveness of the local anesthetics used. 2. The effects of the analgesic(s) and/or anxiolytic(s) used. 3. The degree of discomfort experienced by the patient at the time of the procedure. 4. The patients ability and reliability in recalling and recording the events. 5. The presence and influence of possible secondary gains and/or psychosocial factors. Reported result: Relief experienced during the 1st hour after the procedure: 100 % (Ultra-Short Term Relief)            Interpretative annotation: Clinically appropriate result. Analgesia during this period is likely to be Local Anesthetic and/or IV Sedative (Analgesic/Anxiolytic) related.          Effects of local anesthetic: The analgesic effects attained  during this period are directly associated to the localized infiltration of local anesthetics and therefore cary significant diagnostic value as to the etiological location, or anatomical origin, of the pain. Expected duration of relief is directly dependent on the pharmacodynamics of the local anesthetic used. Long-acting (4-6 hours)  anesthetics used.  Reported result: Relief during the next 4 to 6 hour after the procedure: 100 % (Short-Term Relief)            Interpretative annotation: Clinically appropriate result. Analgesia during this period is likely to be Local Anesthetic-related.          Long-term benefit: Defined as the period of time past the expected duration of local anesthetics (1 hour for short-acting and 4-6 hours for long-acting). With the possible exception of prolonged sympathetic blockade from the local anesthetics, benefits during this period are typically attributed to, or associated with, other factors such as analgesic sensory neuropraxia, antiinflammatory effects, or beneficial biochemical changes provided by agents other than the local anesthetics.  Reported result: Extended relief following procedure: 40 % (ongoing) (Long-Term Relief)            Interpretative annotation: Clinically appropriate result. Good relief. Incomplete therapeutic success. Inflammation plays a part in the etiology to the pain.          Current benefits: Defined as reported results that persistent at this point in time.   Analgesia: 25-50 %            Function: Somewhat improved ROM: Somewhat improved Interpretative annotation: Recurrence of symptoms. No permanent benefit expected. Results would suggest further treatment needed.          Interpretation: Results would suggest therapy to have a positive impact on the patient's condition.                  Plan:  Please see "Plan of Care" for details.       Laboratory Chemistry  Inflammation Markers (CRP: Acute Phase) (ESR: Chronic Phase) No results found for: CRP, ESRSEDRATE               Renal Function Markers Lab Results  Component Value Date   BUN 18 12/31/2016   CREATININE 0.95 12/31/2016   GFRAA >60 12/31/2016   GFRNONAA >60 12/31/2016                 Hepatic Function Markers Lab Results  Component Value Date   AST 30 12/31/2016   ALT 42 12/31/2016   ALBUMIN  4.5 12/31/2016   ALKPHOS 101 12/31/2016                 Electrolytes Lab Results  Component Value Date   NA 132 (L) 12/31/2016   K 4.5 12/31/2016   CL 98 (L) 12/31/2016   CALCIUM 9.8 12/31/2016                 Neuropathy Markers No results found for: [id number]               Bone Pathology Markers Lab Results  Component Value Date   ALKPHOS 101 12/31/2016   CALCIUM 9.8 12/31/2016                 Coagulation Parameters Lab Results  Component Value Date   PLT 204 12/31/2016                 Cardiovascular Markers Lab Results  Component Value Date   HGB 15.3 12/31/2016   HCT 44.4 12/31/2016  Note: Lab results reviewed.  Recent Diagnostic Imaging Results  DG C-Arm 1-60 Min-No Report Fluoroscopy was utilized by the requesting physician.  No radiographic  interpretation.   Complexity Note: Imaging results reviewed. Results shared with Mr. Senne, using Layman's terms.                         Meds   Current Outpatient Prescriptions:  .  atorvastatin (LIPITOR) 80 MG tablet, Take 80 mg by mouth daily., Disp: , Rfl:  .  carvedilol (COREG) 6.25 MG tablet, Take 6.25 mg by mouth 2 (two) times daily with a meal., Disp: , Rfl:  .  cholecalciferol (VITAMIN D) 1000 units tablet, Take 1,000 Units by mouth daily., Disp: , Rfl:  .  digoxin (LANOXIN) 0.125 MG tablet, Take by mouth daily., Disp: , Rfl:  .  furosemide (LASIX) 20 MG tablet, Take 20 mg by mouth., Disp: , Rfl:  .  glimepiride (AMARYL) 4 MG tablet, Take 4 mg by mouth 2 (two) times daily. , Disp: , Rfl:  .  HYDROcodone-acetaminophen (NORCO/VICODIN) 5-325 MG tablet, Take 1 tablet by mouth every 6 (six) hours as needed for moderate pain., Disp: 4 tablet, Rfl: 0 .  insulin glargine (LANTUS) 100 UNIT/ML injection, Inject 20 Units into the skin at bedtime. , Disp: , Rfl:  .  lisinopril (PRINIVIL,ZESTRIL) 20 MG tablet, Take 20 mg by mouth daily., Disp: , Rfl:  .  metFORMIN (GLUCOPHAGE) 1000 MG tablet, Take  1,000 mg by mouth 2 (two) times daily with a meal., Disp: , Rfl:  .  Omega-3 Fatty Acids (FISH OIL) 1000 MG CAPS, Take 1 capsule by mouth daily., Disp: , Rfl:  .  atorvastatin (LIPITOR) 80 MG tablet, Take by mouth., Disp: , Rfl:  .  carvedilol (COREG) 6.25 MG tablet, Take by mouth., Disp: , Rfl:  .  digoxin (LANOXIN) 0.125 MG tablet, Take by mouth., Disp: , Rfl:  .  furosemide (LASIX) 20 MG tablet, Take by mouth., Disp: , Rfl:  .  glimepiride (AMARYL) 4 MG tablet, Take by mouth., Disp: , Rfl:  .  HYDROcodone-acetaminophen (NORCO/VICODIN) 5-325 MG tablet, Take by mouth., Disp: , Rfl:  .  ibuprofen (ADVIL,MOTRIN) 600 MG tablet, Take 1 tablet (600 mg total) by mouth every 6 (six) hours as needed. (Patient not taking: Reported on 01/26/2017), Disp: 30 tablet, Rfl: 0 .  lisinopril (PRINIVIL,ZESTRIL) 20 MG tablet, Take by mouth., Disp: , Rfl:  .  metFORMIN (GLUCOPHAGE) 1000 MG tablet, Take by mouth., Disp: , Rfl:  .  vitamin A 7500 UNIT capsule, Take 7,500 Units by mouth daily., Disp: , Rfl:   ROS  Constitutional: Denies any fever or chills Gastrointestinal: No reported hemesis, hematochezia, vomiting, or acute GI distress Musculoskeletal: Denies any acute onset joint swelling, redness, loss of ROM, or weakness Neurological: No reported episodes of acute onset apraxia, aphasia, dysarthria, agnosia, amnesia, paralysis, loss of coordination, or loss of consciousness  Allergies  Mr. Klausing is allergic to penicillins.  [place]  Drug: Mr. Rolfe  reports that he does not use drugs. Alcohol:  reports that he does not drink alcohol. Tobacco:  reports that he quit smoking about 3 years ago. His smoking use included Cigarettes. He has a 30.00 pack-year smoking history. He has never used smokeless tobacco. Medical:  has a past medical history of Back pain; CHF (congestive heart failure) ([place]); Diabetes mellitus without complication ([place]); Foot drop, left foot; and Sleep apnea. Surgical: Mr. Grismer  has  a past  surgical history that includes Back surgery. Family: family history includes Dementia in his mother; Diabetes in his brother and sister; Early death in his paternal aunt and paternal uncle; Heart disease in his father.  Constitutional Exam  General appearance: Well nourished, well developed, and well hydrated. In no apparent acute distress Vitals:   01/26/17 1003  BP: 126/74  Pulse: 91  Resp: 16  Temp: 98 F (36.7 C)  SpO2: 98%  Weight: 207 lb (93.9 kg)  Height: 5' 9"  (1.753 m)   BMI Assessment: Estimated body mass index is 30.57 kg/m as calculated from the following:   Height as of this encounter: 5' 9"  (1.753 m).   Weight as of this encounter: 207 lb (93.9 kg).  BMI interpretation table: BMI level Category Range association with higher incidence of chronic pain  <18 kg/m2 Underweight   18.5-24.9 kg/m2 Ideal body weight   25-29.9 kg/m2 Overweight Increased incidence by 20%  30-34.9 kg/m2 Obese (Class I) Increased incidence by 68%  35-39.9 kg/m2 Severe obesity (Class II) Increased incidence by 136%  >40 kg/m2 Extreme obesity (Class III) Increased incidence by 254%   BMI Readings from Last 4 Encounters:  01/26/17 30.57 kg/m  01/11/17 30.57 kg/m  12/31/16 31.01 kg/m   Wt Readings from Last 4 Encounters:  01/26/17 207 lb (93.9 kg)  01/11/17 207 lb (93.9 kg)  12/31/16 210 lb (95.3 kg)  Psych/Mental status: Alert, oriented x 3 (person, place, & time)       Eyes: PERLA Respiratory: No evidence of acute respiratory distress  Cervical Spine Area Exam  Skin & Axial Inspection: No masses, redness, edema, swelling, or associated skin lesions Alignment: Symmetrical Functional ROM: Unrestricted ROM      Stability: No instability detected Muscle Tone/Strength: Functionally intact. No obvious neuro-muscular anomalies detected. Sensory (Neurological): Unimpaired Palpation: No palpable anomalies              Upper Extremity (UE) Exam    Side: Right upper extremity  Side:  Left upper extremity  Skin & Extremity Inspection: Skin color, temperature, and hair growth are WNL. No peripheral edema or cyanosis. No masses, redness, swelling, asymmetry, or associated skin lesions. No contractures.  Skin & Extremity Inspection: Skin color, temperature, and hair growth are WNL. No peripheral edema or cyanosis. No masses, redness, swelling, asymmetry, or associated skin lesions. No contractures.  Functional ROM: Unrestricted ROM          Functional ROM: Unrestricted ROM          Muscle Tone/Strength: Functionally intact. No obvious neuro-muscular anomalies detected.  Muscle Tone/Strength: Functionally intact. No obvious neuro-muscular anomalies detected.  Sensory (Neurological): Unimpaired          Sensory (Neurological): Unimpaired          Palpation: No palpable anomalies              Palpation: No palpable anomalies              Specialized Test(s): Deferred         Specialized Test(s): Deferred          Thoracic Spine Area Exam  Skin & Axial Inspection: No masses, redness, or swelling Alignment: Symmetrical Functional ROM: Unrestricted ROM Stability: No instability detected Muscle Tone/Strength: Functionally intact. No obvious neuro-muscular anomalies detected. Sensory (Neurological): Unimpaired Muscle strength & Tone: No palpable anomalies  Lumbar Spine Area Exam  Skin & Axial Inspection: No masses, redness, or swelling Alignment: Symmetrical Functional ROM: Unrestricted ROM      Stability:  No instability detected Muscle Tone/Strength: Functionally intact. No obvious neuro-muscular anomalies detected. Sensory (Neurological): Unimpaired Palpation: No palpable anomalies       Provocative Tests: Lumbar Hyperextension and rotation test: Improved after treatment       Lumbar Lateral bending test: Improved after treatment       Patrick's Maneuver: evaluation deferred today                    Gait & Posture Assessment  Ambulation: Unassisted Gait: Relatively normal  for age and body habitus Posture: WNL   Lower Extremity Exam    Side: Right lower extremity  Side: Left lower extremity  Skin & Extremity Inspection: Skin color, temperature, and hair growth are WNL. No peripheral edema or cyanosis. No masses, redness, swelling, asymmetry, or associated skin lesions. No contractures.  Skin & Extremity Inspection: Skin color, temperature, and hair growth are WNL. No peripheral edema or cyanosis. No masses, redness, swelling, asymmetry, or associated skin lesions. No contractures.  Functional ROM: Unrestricted ROM          Functional ROM: Unrestricted ROM          Muscle Tone/Strength: Functionally intact. No obvious neuro-muscular anomalies detected.  Muscle Tone/Strength: Functionally intact. No obvious neuro-muscular anomalies detected.  Sensory (Neurological): Unimpaired  Sensory (Neurological): Unimpaired  Palpation: No palpable anomalies  Palpation: No palpable anomalies   Assessment  Primary Diagnosis & Pertinent Problem List: The primary encounter diagnosis was Lumbar radiculopathy. Diagnoses of Lumbar degenerative disc disease, Lumbar pain, and Failed back syndrome of lumbar spine were also pertinent to this visit.  Status Diagnosis  Responding Stable Stable 1. Lumbar radiculopathy   2. Lumbar degenerative disc disease   3. Lumbar pain   4. Failed back syndrome of lumbar spine       -Patient endorses moderate benefit from caudal epidural steroid injection that was performed on 01/11/2017. He states that his axial low back and leg pain are approximately 40% improved. Patient endorses occasional groin pain that is not as severe in intensity as before. He believes that the caudal epidural steroid injection has provided him with some benefit. We discussed repeating it to see if we could get a longer lasting therapeutic response. Patient is in agreement with plan. Given the patient's diabetes, after the next caudal epidural steroid injection we will hold  off on any additional neuraxial steroid therapy for 2-3 months.  He also had an extensive discussion about spinal cord stimulation. I showed the patient a spine model explaining to him how the epidural electrodes could assist with his axial low back and leg pain. I provided the patient DVD and informational material about [employer] and mentioned that this is just one company that we use for spinal cord stimulation. If patient is interested in SCS trial,we will need thoracic MRI to rule out thoracic stenosis and he will also need psychological evaluation.  Plan: -Repeat caudal epidural steroid injection #2 -Review and discuss possible spinal cord stimulator trial with patient after he has reviewed DVD. If he would like to proceed, we'll obtain thoracic MRI and referral to psychology.   Lab-work, procedure(s), and/or referral(s): Orders Placed This Encounter  Procedures  . Caudal Epidural Injection    Interventional management options: Planned, scheduled, and/or pending:    -repeat caudal epidural steroid injection (number one done on 01/11/2017 which provided approximately 40% pain relief)   Considering:   -lumbar facet blocks -Left SI joint injection under fluoroscopy which could be contributing to left groin  and hip pain. -Spinal cord stimulator trial (will need thoracic MRI and psychology referral)   PRN Procedures:   To be determined at a later time   Provider-requested follow-up: Return for Procedure.  No future appointments.  Primary Care Physician: System, Pcp Not In Location: [facility] Outpatient [facility] Management [facility] Note by: Gillis Santa, M.D Date: 01/26/2017; Time: 2:13 PM  Patient Instructions  -Repeat caudal epidural steroid injection. -Please review informational materials about spinal cord stimulation. Please bring questions with you to next visit. If you would like to proceed with spinal cord stimulation, we will have to obtain thoracic MRI as well as  psychological evaluation.

## 2017-02-10 ENCOUNTER — Ambulatory Visit (HOSPITAL_BASED_OUTPATIENT_CLINIC_OR_DEPARTMENT_OTHER): Payer: 59 | Admitting: Student in an Organized Health Care Education/Training Program

## 2017-02-10 ENCOUNTER — Encounter: Payer: Self-pay | Admitting: Student in an Organized Health Care Education/Training Program

## 2017-02-10 ENCOUNTER — Ambulatory Visit
Admission: RE | Admit: 2017-02-10 | Discharge: 2017-02-10 | Disposition: A | Discharge status: Home / Self Care | Payer: 59 | Source: Ambulatory Visit | Attending: Student in an Organized Health Care Education/Training Program | Admitting: Student in an Organized Health Care Education/Training Program

## 2017-02-10 VITALS — BP 113/75 | HR 85 | Temp 98.5°F | Resp 18 | Ht 69.0 in | Wt 212.0 lb

## 2017-02-10 DIAGNOSIS — M79605 Pain in left leg: Secondary | ICD-10-CM | POA: Insufficient documentation

## 2017-02-10 DIAGNOSIS — Z880 Allergy status to penicillin: Secondary | ICD-10-CM | POA: Insufficient documentation

## 2017-02-10 DIAGNOSIS — M5116 Intervertebral disc disorders with radiculopathy, lumbar region: Secondary | ICD-10-CM | POA: Diagnosis not present

## 2017-02-10 DIAGNOSIS — M5136 Other intervertebral disc degeneration, lumbar region: Secondary | ICD-10-CM | POA: Diagnosis not present

## 2017-02-10 DIAGNOSIS — M961 Postlaminectomy syndrome, not elsewhere classified: Secondary | ICD-10-CM

## 2017-02-10 DIAGNOSIS — M545 Low back pain, unspecified: Secondary | ICD-10-CM

## 2017-02-10 DIAGNOSIS — R1030 Lower abdominal pain, unspecified: Secondary | ICD-10-CM | POA: Diagnosis present

## 2017-02-10 DIAGNOSIS — M5416 Radiculopathy, lumbar region: Secondary | ICD-10-CM

## 2017-02-10 MED ORDER — ROPIVACAINE HCL 2 MG/ML IJ SOLN
2.0000 mL | Freq: Once | INTRAMUSCULAR | Status: AC
  Administered 2017-02-10: 2 mL via EPIDURAL
  Filled 2017-02-10: qty 10

## 2017-02-10 MED ORDER — SODIUM CHLORIDE 0.9 % IJ SOLN
INTRAMUSCULAR | Status: AC
  Filled 2017-02-10: qty 10

## 2017-02-10 MED ORDER — DEXAMETHASONE SODIUM PHOSPHATE 10 MG/ML IJ SOLN
10.0000 mg | Freq: Once | INTRAMUSCULAR | Status: AC
  Administered 2017-02-10: 10 mg
  Filled 2017-02-10: qty 1

## 2017-02-10 MED ORDER — LIDOCAINE HCL (PF) 1 % IJ SOLN
10.0000 mL | Freq: Once | INTRAMUSCULAR | Status: AC
  Administered 2017-02-10: 5 mL
  Filled 2017-02-10: qty 10

## 2017-02-10 MED ORDER — IOPAMIDOL (ISOVUE-M 200) INJECTION 41%
10.0000 mL | Freq: Once | INTRAMUSCULAR | Status: AC
  Administered 2017-02-10: 10 mL via EPIDURAL
  Filled 2017-02-10: qty 10

## 2017-02-10 MED ORDER — SODIUM CHLORIDE 0.9% FLUSH
2.0000 mL | Freq: Once | INTRAVENOUS | Status: AC
  Administered 2017-02-10: 10 mL

## 2017-02-10 NOTE — Progress Notes (Addendum)
Patient's Name: Emidio Warrell  MRN: [id number]  Referring Provider: Gillis Santa, MD  DOB: 06/28/1960  PCP: System, Pcp Not In  DOS: 02/10/2017  Note by: Gillis Santa, MD  Service setting: Ambulatory outpatient  Specialty: Interventional Pain Management  Patient type: Established  Location: [facility] (AMB) Pain Management Facility  Visit type: Interventional Procedure   Primary Reason for Visit: Interventional Pain Management Treatment. CC: Groin Pain (left) and Leg Pain (anterior thigh and posterior calf- left)  Procedure:  Anesthesia, Analgesia, Anxiolysis:  Type: Diagnostic Epidural Steroid Injection #2 Region: Caudal Level: Sacrococcygeal   Laterality: Midline, directed towards left       Type: Local Anesthesia Local Anesthetic: Lidocaine 1% Route: Infiltration ([place]/IM) IV Access: Declined Sedation: Declined  Indication(s): Analgesia and Anxiety   Indications: 1. Lumbar degenerative disc disease   2. Lumbar radiculopathy   3. Lumbar pain   4. Failed back syndrome of lumbar spine    Pain Score: Pre-procedure: 7 /10 Post-procedure: 0-No pain/10  Pre-op Assessment:  Mr. Brobeck is a 56 y.o. (year old), male patient, seen today for interventional treatment. He  has a past surgical history that includes Back surgery. Mr. Barley has a current medication list which includes the following prescription(s): atorvastatin, carvedilol, cholecalciferol, digoxin, furosemide, glimepiride, hydrocodone-acetaminophen, insulin glargine, lisinopril, metformin, fish oil, potassium chloride sa, atorvastatin, carvedilol, digoxin, furosemide, glimepiride, hydrocodone-acetaminophen, ibuprofen, lisinopril, metformin, and vitamin a. His primarily concern today is the Groin Pain (left) and Leg Pain (anterior thigh and posterior calf- left)  Initial Vital Signs: Blood pressure 108/69, pulse 85, temperature 98.5 F (36.9 C), temperature source Oral, resp. rate 16, height 5\' 9"  (1.753 m), weight 212 lb (96.2  kg), SpO2 100 %. BMI: Estimated body mass index is 31.31 kg/m as calculated from the following:   Height as of this encounter: 5\' 9"  (1.753 m).   Weight as of this encounter: 212 lb (96.2 kg).  Risk Assessment: Allergies: Reviewed. He is allergic to penicillins.  Allergy Precautions: None required Coagulopathies: Reviewed. None identified.  Blood-thinner therapy: None at this time Active Infection(s): Reviewed. None identified. Mr. Knueppel is afebrile  Site Confirmation: Mr. Paradiso was asked to confirm the procedure and laterality before marking the site Procedure checklist: Completed Consent: Before the procedure and under the influence of no sedative(s), amnesic(s), or anxiolytics, the patient was informed of the treatment options, risks and possible complications. To fulfill our ethical and legal obligations, as recommended by the American Medical Association's Code of Ethics, I have informed the patient of my clinical impression; the nature and purpose of the treatment or procedure; the risks, benefits, and possible complications of the intervention; the alternatives, including doing nothing; the risk(s) and benefit(s) of the alternative treatment(s) or procedure(s); and the risk(s) and benefit(s) of doing nothing. The patient was provided information about the general risks and possible complications associated with the procedure. These may include, but are not limited to: failure to achieve desired goals, infection, bleeding, organ or nerve damage, allergic reactions, paralysis, and death. In addition, the patient was informed of those risks and complications associated to Spine-related procedures, such as failure to decrease pain; infection (i.e.: Meningitis, epidural or intraspinal abscess); bleeding (i.e.: epidural hematoma, subarachnoid hemorrhage, or any other type of intraspinal or peri-dural bleeding); organ or nerve damage (i.e.: Any type of peripheral nerve, nerve root, or spinal  cord injury) with subsequent damage to sensory, motor, and/or autonomic systems, resulting in permanent pain, numbness, and/or weakness of one or several areas of the body; allergic reactions; (i.e.:  anaphylactic reaction); and/or death. Furthermore, the patient was informed of those risks and complications associated with the medications. These include, but are not limited to: allergic reactions (i.e.: anaphylactic or anaphylactoid reaction(s)); adrenal axis suppression; blood sugar elevation that in diabetics may result in ketoacidosis or comma; water retention that in patients with history of congestive heart failure may result in shortness of breath, pulmonary edema, and decompensation with resultant heart failure; weight gain; swelling or edema; medication-induced neural toxicity; particulate matter embolism and blood vessel occlusion with resultant organ, and/or nervous system infarction; and/or aseptic necrosis of one or more joints. Finally, the patient was informed that Medicine is not an exact science; therefore, there is also the possibility of unforeseen or unpredictable risks and/or possible complications that may result in a catastrophic outcome. The patient indicated having understood very clearly. We have given the patient no guarantees and we have made no promises. Enough time was given to the patient to ask questions, all of which were answered to the patient's satisfaction. Mr. Nimmons has indicated that he wanted to continue with the procedure. Attestation: I, the ordering provider, attest that I have discussed with the patient the benefits, risks, side-effects, alternatives, likelihood of achieving goals, and potential problems during recovery for the procedure that I have provided informed consent. Date: 02/10/2017; Time: 9:18 AM  Pre-Procedure Preparation:  Monitoring: As per clinic protocol. Respiration, ETCO2, SpO2, BP, heart rate and rhythm monitor placed and checked for adequate  function Safety Precautions: Patient was assessed for positional comfort and pressure points before starting the procedure. Time-out: I initiated and conducted the "Time-out" before starting the procedure, as per protocol. The patient was asked to participate by confirming the accuracy of the "Time Out" information. Verification of the correct person, site, and procedure were performed and confirmed by me, the nursing staff, and the patient. "Time-out" conducted as per Joint Commission's Universal Protocol (UP.01.01.01). "Time-out" Date & Time: 02/10/2017; 0938 hrs.  Description of Procedure Process:   Position: Prone Target Area: Caudal Epidural Canal. Approach: Midline approach. Area Prepped: Entire Posterior Sacrococcygeal Region Prepping solution: ChloraPrep (2% chlorhexidine gluconate and 70% isopropyl alcohol) Safety Precautions: Aspiration looking for blood return was conducted prior to all injections. At no point did we inject any substances, as a needle was being advanced. No attempts were made at seeking any paresthesias. Safe injection practices and needle disposal techniques used. Medications properly checked for expiration dates. SDV (single dose vial) medications used. Description of the Procedure: Protocol guidelines were followed. The patient was placed in position over the fluoroscopy table. The target area was identified and the area prepped in the usual manner. Skin desensitized using vapocoolant spray. Skin & deeper tissues infiltrated with local anesthetic. Appropriate amount of time allowed to pass for local anesthetics to take effect. The procedure needles were then advanced to the target area. Proper needle placement secured. Negative aspiration confirmed. Solution injected in intermittent fashion, asking for systemic symptoms every 0.5cc of injectate. The needles were then removed and the area cleansed, making sure to leave some of the prepping solution back to take advantage of  its long term bactericidal properties. Vitals:   02/10/17 0934 02/10/17 0939 02/10/17 0942 02/10/17 0947  BP: 109/76 107/76 105/70 113/75  Pulse:      Resp: 18 17 17 18   Temp:      TempSrc:      SpO2: 95% 95% 96% 96%  Weight:      Height:        Start Time:  0941 hrs. End Time: 0946 hrs. Materials:  Needle(s) Type: Epidural needle Gauge: 17G Length: 3.5-in Medication(s): We administered iopamidol, ropivacaine (PF) 2 mg/mL (0.2%), sodium chloride flush, lidocaine (PF), and dexamethasone. Please see chart orders for dosing details. 6 cc of normal saline, 2 cc of 0.2% ropivacaine, 1 cc of Decadron equals 9 cc total Imaging Guidance (Spinal):  Type of Imaging Technique: Fluoroscopy Guidance (Spinal) Indication(s): Assistance in needle guidance and placement for procedures requiring needle placement in or near specific anatomical locations not easily accessible without such assistance. Exposure Time: Please see nurses notes. Contrast: Before injecting any contrast, we confirmed that the patient did not have an allergy to iodine, shellfish, or radiological contrast. Once satisfactory needle placement was completed at the desired level, radiological contrast was injected. Contrast injected under live fluoroscopy. No contrast complications. See chart for type and volume of contrast used. Fluoroscopic Guidance: I was personally present during the use of fluoroscopy. "Tunnel Vision Technique" used to obtain the best possible view of the target area. Parallax error corrected before commencing the procedure. "Direction-depth-direction" technique used to introduce the needle under continuous pulsed fluoroscopy. Once target was reached, antero-posterior, oblique, and lateral fluoroscopic projection used confirm needle placement in all planes. Images permanently stored in EMR. Interpretation: I personally interpreted the imaging intraoperatively. Adequate needle placement confirmed in multiple planes.  Appropriate spread of contrast into desired area was observed. No evidence of afferent or efferent intravascular uptake. No intrathecal or subarachnoid spread observed. Permanent images saved into the patient's record.  Antibiotic Prophylaxis:  Indication(s): None identified Antibiotic given: None  Post-operative Assessment:  EBL: None Complications: No immediate post-treatment complications observed by team, or reported by patient. Note: The patient tolerated the entire procedure well. A repeat set of vitals were taken after the procedure and the patient was kept under observation following institutional policy, for this type of procedure. Post-procedural neurological assessment was performed, showing return to baseline, prior to discharge. The patient was provided with post-procedure discharge instructions, including a section on how to identify potential problems. Should any problems arise concerning this procedure, the patient was given instructions to immediately contact us, at any time, without hesitation. In any case, we plan to contact the patient by telephone for a follow-up status report regarding this interventional procedure. Comments:  No additional relevant information. 5 out of 5 strength bilateral lower extremity: Plantar flexion, dorsiflexion, knee flexion, knee extension. Plan of Care  Follow up in 2 weeks for post-procedural evaluation  Imaging Orders     DG C-Arm 1-60 Min-No Report Procedure Orders    No procedure(s) ordered today    Medications ordered for procedure: Meds ordered this encounter  Medications  . iopamidol (ISOVUE-M) 41 % intrathecal injection 10 mL  . ropivacaine (PF) 2 mg/mL (0.2%) (NAROPIN) injection 2 mL  . sodium chloride flush (NS) 0.9 % injection 2 mL  . lidocaine (PF) (XYLOCAINE) 1 % injection 10 mL  . dexamethasone (DECADRON) injection 10 mg   Medications administered: We administered iopamidol, ropivacaine (PF) 2 mg/mL (0.2%), sodium  chloride flush, lidocaine (PF), and dexamethasone.  See the medical record for exact dosing, route, and time of administration.  New Prescriptions   No medications on file   Disposition: Discharge home  Discharge Date & Time: 02/10/2017;   hrs.   Physician-requested Follow-up: Return in about 2 weeks (around 02/24/2017) for post procedure evaluation. Future Appointments Date Time Provider [place]  02/25/2017 9:30 AM Gillis Santa, MD [facility] None   Primary Care Physician: System,  Pcp Not In Location: [facility] Note by: Gillis Santa, MD Date: 02/10/2017; Time: 10:03 AM  Disclaimer:  Medicine is not an exact science. The only guarantee in medicine is that nothing is guaranteed. It is important to note that the decision to proceed with this intervention was based on the information collected from the patient. The Data and conclusions were drawn from the patient's questionnaire, the interview, and the physical examination. Because the information was provided in large part by the patient, it cannot be guaranteed that it has not been purposely or unconsciously manipulated. Every effort has been made to obtain as much relevant data as possible for this evaluation. It is important to note that the conclusions that lead to this procedure are derived in large part from the available data. Always take into account that the treatment will also be dependent on availability of resources and existing treatment guidelines, considered by other Pain Management Practitioners as being common knowledge and practice, at the time of the intervention. For Medico-Legal purposes, it is also important to point out that variation in procedural techniques and pharmacological choices are the acceptable norm. The indications, contraindications, technique, and results of the above procedure should only be interpreted and judged by a Board-Certified Interventional Pain Specialist with  extensive familiarity and expertise in the same exact procedure and technique.

## 2017-02-10 NOTE — Patient Instructions (Signed)

## 2017-02-10 NOTE — Progress Notes (Signed)
Safety precautions to be maintained throughout the outpatient stay will include: orient to surroundings, keep bed in low position, maintain call bell within reach at all times, provide assistance with transfer out of bed and ambulation.  

## 2017-02-10 NOTE — Addendum Note (Signed)
Addended by: Gillis Santa on: 02/10/2017 09:54 AM   Modules accepted: Level of Service

## 2017-02-25 ENCOUNTER — Encounter: Payer: Self-pay | Admitting: Student in an Organized Health Care Education/Training Program

## 2017-02-25 ENCOUNTER — Ambulatory Visit
Payer: 59 | Attending: Student in an Organized Health Care Education/Training Program | Admitting: Student in an Organized Health Care Education/Training Program

## 2017-02-25 VITALS — BP 91/67 | HR 87 | Temp 97.8°F | Resp 16 | Ht 69.0 in | Wt 208.0 lb

## 2017-02-25 DIAGNOSIS — M5116 Intervertebral disc disorders with radiculopathy, lumbar region: Secondary | ICD-10-CM | POA: Diagnosis present

## 2017-02-25 DIAGNOSIS — M5136 Other intervertebral disc degeneration, lumbar region: Secondary | ICD-10-CM | POA: Diagnosis not present

## 2017-02-25 DIAGNOSIS — M545 Low back pain, unspecified: Secondary | ICD-10-CM

## 2017-02-25 DIAGNOSIS — Z880 Allergy status to penicillin: Secondary | ICD-10-CM | POA: Diagnosis not present

## 2017-02-25 DIAGNOSIS — E119 Type 2 diabetes mellitus without complications: Secondary | ICD-10-CM | POA: Diagnosis not present

## 2017-02-25 DIAGNOSIS — Z794 Long term (current) use of insulin: Secondary | ICD-10-CM | POA: Diagnosis not present

## 2017-02-25 DIAGNOSIS — Z79899 Other long term (current) drug therapy: Secondary | ICD-10-CM | POA: Diagnosis not present

## 2017-02-25 DIAGNOSIS — G4730 Sleep apnea, unspecified: Secondary | ICD-10-CM | POA: Diagnosis not present

## 2017-02-25 DIAGNOSIS — Z87891 Personal history of nicotine dependence: Secondary | ICD-10-CM | POA: Diagnosis not present

## 2017-02-25 DIAGNOSIS — I509 Heart failure, unspecified: Secondary | ICD-10-CM | POA: Diagnosis not present

## 2017-02-25 DIAGNOSIS — M21372 Foot drop, left foot: Secondary | ICD-10-CM | POA: Diagnosis not present

## 2017-02-25 DIAGNOSIS — M5416 Radiculopathy, lumbar region: Secondary | ICD-10-CM | POA: Diagnosis not present

## 2017-02-25 DIAGNOSIS — M961 Postlaminectomy syndrome, not elsewhere classified: Secondary | ICD-10-CM | POA: Insufficient documentation

## 2017-02-25 NOTE — Progress Notes (Signed)
Safety precautions to be maintained throughout the outpatient stay will include: orient to surroundings, keep bed in low position, maintain call bell within reach at all times, provide assistance with transfer out of bed and ambulation.  

## 2017-02-25 NOTE — Progress Notes (Signed)
Patient's Name: Juan Adkins  MRN: [id number]  Referring Provider: No ref. provider found  DOB: November 24, 1960  PCP: System, Pcp Not In  DOS: 02/25/2017  Note by: Gillis Santa, MD  Service setting: Ambulatory outpatient  Specialty: Interventional Pain Management  Location: [facility] (AMB) Pain Management Facility    Patient type: Established   Primary Reason(s) for Visit: Encounter for post-procedure evaluation of chronic illness with mild to moderate exacerbation CC: Back Pain (lower)  HPI  Juan Adkins is a 56 y.o. year old, male patient, who comes today for a post-procedure evaluation. He has Lumbar radiculopathy; Lumbar degenerative disc disease; Lumbar pain; and Failed back syndrome of lumbar spine on their problem list. His primarily concern today is the Back Pain (lower)  Pain Assessment: Location: Lower Back Radiating: left groin and left leg to the ankle Onset: More than a month ago Duration: Chronic pain Quality: Radiating Severity: 6 /10 (self-reported pain score)  Note: Reported level is inconsistent with clinical observations. Clinically the patient looks like a 3/10 A 3/10 is viewed as "Moderate" and described as significantly interfering with activities of daily living (ADL). It becomes difficult to feed, bathe, get dressed, get on and off the toilet or to perform personal hygiene functions. Difficult to get in and out of bed or a chair without assistance. Very distracting. With effort, it can be ignored when deeply involved in activities.       When using our objective Pain Scale, levels between 6 and 10/10 are said to belong in an emergency room, as it progressively worsens from a 6/10, described as severely limiting, requiring emergency care not usually available at an outpatient pain management facility. At a 6/10 level, communication becomes difficult and requires great effort. Assistance to reach the emergency department may be required. Facial flushing and profuse sweating along with  potentially dangerous increases in heart rate and blood pressure will be evident. Effect on ADL:   Timing: Intermittent Modifying factors: repositioning, medication  Juan Adkins comes in today for post-procedure evaluation after the treatment done on 02/10/2017.  Further details on both, my assessment(s), as well as the proposed treatment plan, please see below.  Post-Procedure Assessment  02/10/2017 Procedure: Caudal #2 Pre-procedure pain score:  7/10 Post-procedure pain score: 0/10         Influential Factors: BMI: 30.72 kg/m Intra-procedural challenges: None observed.         Assessment challenges: None detected.              Reported side-effects: None.        Post-procedural adverse reactions or complications: None reported         Sedation: Please see nurses note. When no sedatives are used, the analgesic levels obtained are directly associated to the effectiveness of the local anesthetics. However, when sedation is provided, the level of analgesia obtained during the initial 1 hour following the intervention, is believed to be the result of a combination of factors. These factors may include, but are not limited to: 1. The effectiveness of the local anesthetics used. 2. The effects of the analgesic(s) and/or anxiolytic(s) used. 3. The degree of discomfort experienced by the patient at the time of the procedure. 4. The patients ability and reliability in recalling and recording the events. 5. The presence and influence of possible secondary gains and/or psychosocial factors. Reported result: Relief experienced during the 1st hour after the procedure: 60 % (Ultra-Short Term Relief)            Interpretative annotation:  Clinically appropriate result. Analgesia during this period is likely to be Local Anesthetic and/or IV Sedative (Analgesic/Anxiolytic) related.          Effects of local anesthetic: The analgesic effects attained during this period are directly associated to the  localized infiltration of local anesthetics and therefore cary significant diagnostic value as to the etiological location, or anatomical origin, of the pain. Expected duration of relief is directly dependent on the pharmacodynamics of the local anesthetic used. Long-acting (4-6 hours) anesthetics used.  Reported result: Relief during the next 4 to 6 hour after the procedure: 70 % (Short-Term Relief)            Interpretative annotation: Clinically appropriate result. Analgesia during this period is likely to be Local Anesthetic-related.          Long-term benefit: Defined as the period of time past the expected duration of local anesthetics (1 hour for short-acting and 4-6 hours for long-acting). With the possible exception of prolonged sympathetic blockade from the local anesthetics, benefits during this period are typically attributed to, or associated with, other factors such as analgesic sensory neuropraxia, antiinflammatory effects, or beneficial biochemical changes provided by agents other than the local anesthetics.  Reported result: Extended relief following procedure: 75 % (Long-Term Relief)            Interpretative annotation: Clinically appropriate result. Good relief. Therapeutic success. Inflammation plays a part in the etiology to the pain.          Current benefits: Defined as reported results that persistent at this point in time.   Analgesia: >75 % Juan Adkins reports that both, extremity and the axial pain improved with the treatment. Function: Juan Adkins reports improvement in function ROM: Juan Adkins reports improvement in ROM Interpretative annotation: Ongoing benefit. Therapeutic success. Effective therapeutic approach.          Interpretation: Results would suggest a successful diagnostic and therapeutic intervention.                  Plan:  Set up procedure as a PRN palliative treatment option for this patient.        Laboratory Chemistry  Inflammation Markers (CRP:  Acute Phase) (ESR: Chronic Phase) No results found for: CRP, ESRSEDRATE               Renal Function Markers Lab Results  Component Value Date   BUN 18 12/31/2016   CREATININE 0.95 12/31/2016   GFRAA >60 12/31/2016   GFRNONAA >60 12/31/2016                 Hepatic Function Markers Lab Results  Component Value Date   AST 30 12/31/2016   ALT 42 12/31/2016   ALBUMIN 4.5 12/31/2016   ALKPHOS 101 12/31/2016                 Electrolytes Lab Results  Component Value Date   NA 132 (L) 12/31/2016   K 4.5 12/31/2016   CL 98 (L) 12/31/2016   CALCIUM 9.8 12/31/2016                 Neuropathy Markers No results found for: [id number]               Bone Pathology Markers Lab Results  Component Value Date   ALKPHOS 101 12/31/2016   CALCIUM 9.8 12/31/2016                 Rheumatology Markers No results found for: LABURIC  Coagulation Parameters Lab Results  Component Value Date   PLT 204 12/31/2016                 Cardiovascular Markers Lab Results  Component Value Date   HGB 15.3 12/31/2016   HCT 44.4 12/31/2016                 CA Markers No results found for: CEA, CA125               Note: Lab results reviewed.  Recent Diagnostic Imaging Results  DG C-Arm 1-60 Min-No Report Fluoroscopy was utilized by the requesting physician.  No radiographic  interpretation.   Complexity Note: Imaging results reviewed. Results shared with Juan Adkins, using Layman's terms.                         Meds   Current Outpatient Medications:  .  atorvastatin (LIPITOR) 80 MG tablet, Take 80 mg by mouth daily., Disp: , Rfl:  .  carvedilol (COREG) 6.25 MG tablet, Take 6.25 mg by mouth 2 (two) times daily with a meal., Disp: , Rfl:  .  cholecalciferol (VITAMIN D) 1000 units tablet, Take 1,000 Units by mouth daily., Disp: , Rfl:  .  digoxin (LANOXIN) 0.125 MG tablet, Take by mouth daily., Disp: , Rfl:  .  furosemide (LASIX) 20 MG tablet, Take 20 mg by mouth., Disp: ,  Rfl:  .  glimepiride (AMARYL) 4 MG tablet, Take 4 mg by mouth 2 (two) times daily. , Disp: , Rfl:  .  HYDROcodone-acetaminophen (NORCO/VICODIN) 5-325 MG tablet, Take 1 tablet by mouth every 6 (six) hours as needed for moderate pain., Disp: 4 tablet, Rfl: 0 .  insulin glargine (LANTUS) 100 UNIT/ML injection, Inject 20 Units into the skin at bedtime. , Disp: , Rfl:  .  lisinopril (PRINIVIL,ZESTRIL) 20 MG tablet, Take 20 mg by mouth daily., Disp: , Rfl:  .  metFORMIN (GLUCOPHAGE) 1000 MG tablet, Take 1,000 mg by mouth 2 (two) times daily with a meal., Disp: , Rfl:  .  Omega-3 Fatty Acids (FISH OIL) 1000 MG CAPS, Take 1 capsule by mouth daily., Disp: , Rfl:  .  potassium chloride SA (K-DUR,KLOR-CON) 20 MEQ tablet, Take 20 mEq by mouth daily., Disp: , Rfl:  .  atorvastatin (LIPITOR) 80 MG tablet, Take by mouth., Disp: , Rfl:  .  carvedilol (COREG) 6.25 MG tablet, Take by mouth., Disp: , Rfl:  .  digoxin (LANOXIN) 0.125 MG tablet, Take by mouth., Disp: , Rfl:  .  furosemide (LASIX) 20 MG tablet, Take by mouth., Disp: , Rfl:  .  glimepiride (AMARYL) 4 MG tablet, Take by mouth., Disp: , Rfl:  .  HYDROcodone-acetaminophen (NORCO/VICODIN) 5-325 MG tablet, Take by mouth., Disp: , Rfl:  .  ibuprofen (ADVIL,MOTRIN) 600 MG tablet, Take 1 tablet (600 mg total) by mouth every 6 (six) hours as needed. (Patient not taking: Reported on 01/26/2017), Disp: 30 tablet, Rfl: 0 .  lisinopril (PRINIVIL,ZESTRIL) 20 MG tablet, Take by mouth., Disp: , Rfl:  .  metFORMIN (GLUCOPHAGE) 1000 MG tablet, Take by mouth., Disp: , Rfl:  .  vitamin A 7500 UNIT capsule, Take 7,500 Units by mouth daily., Disp: , Rfl:   ROS  Constitutional: Denies any fever or chills Gastrointestinal: No reported hemesis, hematochezia, vomiting, or acute GI distress Musculoskeletal: Denies any acute onset joint swelling, redness, loss of ROM, or weakness Neurological: No reported episodes of acute onset apraxia, aphasia, dysarthria, agnosia, amnesia,  paralysis, loss of coordination, or loss of consciousness  Allergies  Juan Adkins is allergic to penicillins.  [place]  Drug: Juan Adkins  reports that he does not use drugs. Alcohol:  reports that he does not drink alcohol. Tobacco:  reports that he quit smoking about 3 years ago. His smoking use included cigarettes. He has a 30.00 pack-year smoking history. he has never used smokeless tobacco. Medical:  has a past medical history of Back pain, CHF (congestive heart failure) ([place]), Diabetes mellitus without complication ([place]), Foot drop, left foot, and Sleep apnea. Surgical: Juan Adkins  has a past surgical history that includes Back surgery. Family: family history includes Dementia in his mother; Diabetes in his brother and sister; Early death in his paternal aunt and paternal uncle; Heart disease in his father.  Constitutional Exam  General appearance: Well nourished, well developed, and well hydrated. In no apparent acute distress Vitals:   02/25/17 1015  BP: 91/67  Pulse: 87  Resp: 16  Temp: 97.8 F (36.6 C)  TempSrc: Oral  SpO2: 99%  Weight: 208 lb (94.3 kg)  Height: 5' 9"  (1.753 m)   BMI Assessment: Estimated body mass index is 30.72 kg/m as calculated from the following:   Height as of this encounter: 5' 9"  (1.753 m).   Weight as of this encounter: 208 lb (94.3 kg).  BMI interpretation table: BMI level Category Range association with higher incidence of chronic pain  <18 kg/m2 Underweight   18.5-24.9 kg/m2 Ideal body weight   25-29.9 kg/m2 Overweight Increased incidence by 20%  30-34.9 kg/m2 Obese (Class I) Increased incidence by 68%  35-39.9 kg/m2 Severe obesity (Class II) Increased incidence by 136%  >40 kg/m2 Extreme obesity (Class III) Increased incidence by 254%   BMI Readings from Last 4 Encounters:  02/25/17 30.72 kg/m  02/10/17 31.31 kg/m  01/26/17 30.57 kg/m  01/11/17 30.57 kg/m   Wt Readings from Last 4 Encounters:  02/25/17 208 lb (94.3 kg)   02/10/17 212 lb (96.2 kg)  01/26/17 207 lb (93.9 kg)  01/11/17 207 lb (93.9 kg)  Psych/Mental status: Alert, oriented x 3 (person, place, & time)       Eyes: PERLA Respiratory: No evidence of acute respiratory distress  Cervical Spine Area Exam  Skin & Axial Inspection: No masses, redness, edema, swelling, or associated skin lesions Alignment: Symmetrical Functional ROM: Unrestricted ROM      Stability: No instability detected Muscle Tone/Strength: Functionally intact. No obvious neuro-muscular anomalies detected. Sensory (Neurological): Unimpaired Palpation: No palpable anomalies              Upper Extremity (UE) Exam    Side: Right upper extremity  Side: Left upper extremity  Skin & Extremity Inspection: Skin color, temperature, and hair growth are WNL. No peripheral edema or cyanosis. No masses, redness, swelling, asymmetry, or associated skin lesions. No contractures.  Skin & Extremity Inspection: Skin color, temperature, and hair growth are WNL. No peripheral edema or cyanosis. No masses, redness, swelling, asymmetry, or associated skin lesions. No contractures.  Functional ROM: Unrestricted ROM          Functional ROM: Unrestricted ROM          Muscle Tone/Strength: Functionally intact. No obvious neuro-muscular anomalies detected.  Muscle Tone/Strength: Functionally intact. No obvious neuro-muscular anomalies detected.  Sensory (Neurological): Unimpaired          Sensory (Neurological): Unimpaired          Palpation: No palpable anomalies  Palpation: No palpable anomalies              Specialized Test(s): Deferred         Specialized Test(s): Deferred          Thoracic Spine Area Exam  Skin & Axial Inspection: No masses, redness, or swelling Alignment: Symmetrical Functional ROM: Unrestricted ROM Stability: No instability detected Muscle Tone/Strength: Functionally intact. No obvious neuro-muscular anomalies detected. Sensory (Neurological): Unimpaired Muscle  strength & Tone: No palpable anomalies  Lumbar Spine Area Exam  Skin & Axial Inspection: No masses, redness, or swelling Alignment: Symmetrical Functional ROM: Unrestricted ROM      Stability: No instability detected Muscle Tone/Strength: Functionally intact. No obvious neuro-muscular anomalies detected. Sensory (Neurological): Unimpaired Palpation: No palpable anomalies       Provocative Tests: Lumbar Hyperextension and rotation test: Improved after treatment       Lumbar Lateral bending test: Improved after treatment       Patrick's Maneuver: Improved after treatment                    Gait & Posture Assessment  Ambulation: Unassisted Gait: Relatively normal for age and body habitus Posture: WNL   Lower Extremity Exam    Side: Right lower extremity  Side: Left lower extremity  Skin & Extremity Inspection: Skin color, temperature, and hair growth are WNL. No peripheral edema or cyanosis. No masses, redness, swelling, asymmetry, or associated skin lesions. No contractures.  Skin & Extremity Inspection: Skin color, temperature, and hair growth are WNL. No peripheral edema or cyanosis. No masses, redness, swelling, asymmetry, or associated skin lesions. No contractures.  Functional ROM: Unrestricted ROM          Functional ROM: Unrestricted ROM          Muscle Tone/Strength: Functionally intact. No obvious neuro-muscular anomalies detected.  Muscle Tone/Strength: Functionally intact. No obvious neuro-muscular anomalies detected.  Sensory (Neurological): Unimpaired  Sensory (Neurological): Unimpaired  Palpation: No palpable anomalies  Palpation: No palpable anomalies   Assessment  Primary Diagnosis & Pertinent Problem List: The primary encounter diagnosis was Lumbar radiculopathy. Diagnoses of Lumbar degenerative disc disease, Lumbar pain, and Failed back syndrome of lumbar spine were also pertinent to this visit.  Status Diagnosis  Improved Controlled Improving 1. Lumbar  radiculopathy   2. Lumbar degenerative disc disease   3. Lumbar pain   4. Failed back syndrome of lumbar spine     Problems updated and reviewed during this visit: Problem  Lumbar Radiculopathy  Lumbar Degenerative Disc Disease  Lumbar Pain  Failed Back Syndrome of Lumbar Spine   55 year old male with lumbar radiculopathy status post 2 caudal epidural steroid injections on January 11, 2017 and February 10, 2017.  Patient returns today for follow-up stating significant improvement of his axial low back and left leg symptoms.  He notes greater ease with ambulation, improved pain during the course of the day, increased ability to perform activities of daily living without having to stop or rest secondary to pain.  I am overall pleased with the patient's results from his 2 caudal epidural steroid injections.  He can follow with me as needed.  I have placed an order for an as needed caudal epidural steroid injection should the patient's symptoms occur.  Furthermore if he is interested in discussing spinal cord stimulation further, he is more than welcome to create an appointment to do that.  Plan: -PRN caudal ESI -Follow-up as needed  Plan of Care  Pharmacotherapy (Medications Ordered):  No orders of the defined types were placed in this encounter.  Lab-work, procedure(s), and/or referral(s): Orders Placed This Encounter  Procedures  . Caudal Epidural Injection   Interventional management options: Planned, scheduled, and/or pending:    -repeat caudal epidural steroid injection (number one done on 01/11/2017 which provided approximately 40% pain relief and most recent one on October 24 that provided approximately 70% pain relief)   Considering:   -lumbar facet blocks -Left SI joint injection under fluoroscopy which could be contributing to left groin and hip pain. -Spinal cord stimulator trial (will need thoracic MRI and psychology referral)     PRN Procedures:   PRN caudal ESI    Provider-requested follow-up: Return if symptoms worsen or fail to improve, for Procedure.  No future appointments.  Primary Care Physician: System, Pcp Not In Location: [facility] Note by: Gillis Santa, M.D Date: 02/25/2017; Time: 3:17 PM  Patient Instructions  Please call to schedule repeat caudal after [place] should symptoms return

## 2017-02-25 NOTE — Patient Instructions (Signed)
Please call to schedule repeat caudal after [place] should symptoms return

## 2017-03-05 DIAGNOSIS — Z794 Long term (current) use of insulin: Secondary | ICD-10-CM

## 2017-03-05 DIAGNOSIS — E1169 Type 2 diabetes mellitus with other specified complication: Secondary | ICD-10-CM | POA: Insufficient documentation

## 2017-03-05 DIAGNOSIS — I10 Essential (primary) hypertension: Secondary | ICD-10-CM

## 2017-03-05 DIAGNOSIS — E1159 Type 2 diabetes mellitus with other circulatory complications: Secondary | ICD-10-CM | POA: Insufficient documentation

## 2017-03-05 DIAGNOSIS — E1142 Type 2 diabetes mellitus with diabetic polyneuropathy: Secondary | ICD-10-CM | POA: Insufficient documentation

## 2017-03-05 DIAGNOSIS — E785 Hyperlipidemia, unspecified: Secondary | ICD-10-CM

## 2017-03-05 DIAGNOSIS — I152 Hypertension secondary to endocrine disorders: Secondary | ICD-10-CM | POA: Insufficient documentation

## 2017-03-05 LAB — HEMOGLOBIN A1C: Hemoglobin A1C: 12.4

## 2017-03-05 LAB — MICROALBUMIN, URINE: Microalb, Ur: <30.4

## 2017-03-05 LAB — HM DIABETES FOOT EXAM: HM DIABETIC FOOT EXAM: NORMAL

## 2017-04-21 ENCOUNTER — Telehealth: Payer: Self-pay | Admitting: Student in an Organized Health Care Education/Training Program

## 2017-04-21 NOTE — Telephone Encounter (Signed)
Patient lvm on 12-31 at 7:56 stating he is in a lot of pain and would like Dr. Holley Raring to call him asap

## 2017-04-21 NOTE — Telephone Encounter (Signed)
Attempted to call patient  Left voicemail that Dr Holley Raring was our of office until next week and to go to the ED if needed.

## 2017-04-26 HISTORY — PX: LUMBAR LAMINECTOMY: SHX95

## 2017-04-30 LAB — HEPATIC FUNCTION PANEL
ALK PHOS: 72 (ref 25–125)
ALT: 38 (ref 10–40)
AST: 23 (ref 14–40)
BILIRUBIN, TOTAL: 0.6

## 2017-04-30 LAB — BASIC METABOLIC PANEL
BUN: 22 — AB (ref 4–21)
Creatinine: 0.8 (ref 0.6–1.3)
Glucose: 256
Potassium: 4 (ref 3.4–5.3)

## 2017-04-30 LAB — CBC AND DIFFERENTIAL
HCT: 39 — AB (ref 41–53)
HEMOGLOBIN: 13.2 — AB (ref 13.5–17.5)
Platelets: 164 (ref 150–399)
WBC: 11

## 2017-05-21 ENCOUNTER — Other Ambulatory Visit: Payer: Self-pay | Admitting: Internal Medicine

## 2017-05-21 ENCOUNTER — Encounter: Payer: Self-pay | Admitting: Internal Medicine

## 2017-05-24 ENCOUNTER — Ambulatory Visit: Payer: 59 | Admitting: Internal Medicine

## 2017-05-24 ENCOUNTER — Encounter: Payer: Self-pay | Admitting: Internal Medicine

## 2017-05-24 VITALS — BP 100/66 | HR 103 | Ht 69.0 in | Wt 219.0 lb

## 2017-05-24 DIAGNOSIS — N453 Epididymo-orchitis: Secondary | ICD-10-CM

## 2017-05-24 DIAGNOSIS — N400 Enlarged prostate without lower urinary tract symptoms: Secondary | ICD-10-CM

## 2017-05-24 DIAGNOSIS — Z794 Long term (current) use of insulin: Secondary | ICD-10-CM | POA: Diagnosis not present

## 2017-05-24 DIAGNOSIS — I420 Dilated cardiomyopathy: Secondary | ICD-10-CM

## 2017-05-24 DIAGNOSIS — E1142 Type 2 diabetes mellitus with diabetic polyneuropathy: Secondary | ICD-10-CM

## 2017-05-24 DIAGNOSIS — M5416 Radiculopathy, lumbar region: Secondary | ICD-10-CM

## 2017-05-24 MED ORDER — SULFAMETHOXAZOLE-TRIMETHOPRIM 800-160 MG PO TABS: 1.0000 | ORAL_TABLET | Freq: Two times a day (BID) | ORAL | 0 refills | Status: AC

## 2017-05-24 NOTE — Patient Instructions (Addendum)
Call [facility] for diabetic eye exam.  Ask them to send me the visit note. Epididymitis Epididymitis is swelling (inflammation) of the epididymis. The epididymis is a cord-like structure that is located along the top and back part of the testicle. It collects and stores sperm from the testicle. This condition can also cause pain and swelling of the testicle and scrotum. Symptoms usually start suddenly (acute epididymitis). Sometimes epididymitis starts gradually and lasts for a while (chronic epididymitis). This type may be harder to treat. What are the causes? In men 44 and younger, this condition is usually caused by a bacterial infection or sexually transmitted disease (STD), such as:  Gonorrhea.  Chlamydia.  In men 66 and older who do not have anal sex, this condition is usually caused by bacteria from a blockage or abnormalities in the urinary system. These can result from:  Having a tube placed into the bladder (urinary catheter).  Having an enlarged or inflamed prostate gland.  Having recent urinary tract surgery.  In men who have a condition that weakens the body's defense system (immune system), such as HIV, this condition can be caused by:  Other bacteria, including tuberculosis and syphilis.  Viruses.  Fungi.  Sometimes this condition occurs without infection. That may happen if urine flows backward into the epididymis after heavy lifting or straining. What increases the risk? This condition is more likely to develop in men:  Who have unprotected sex with more than one partner.  Who have anal sex.  Who have recently had surgery.  Who have a urinary catheter.  Who have urinary problems.  Who have a suppressed immune system.  What are the signs or symptoms? This condition usually begins suddenly with chills, fever, and pain behind the scrotum and in the testicle. Other symptoms include:  Swelling of the scrotum, testicle, or both.  Pain whenejaculatingor  urinating.  Pain in the back or belly.  Nausea.  Itching and discharge from the penis.  Frequent need to pass urine.  Redness and tenderness of the scrotum.  How is this diagnosed? Your health care provider can diagnose this condition based on your symptoms and medical history. Your health care provider will also do a physical exam to ask about your symptoms and check your scrotum and testicle for swelling, pain, and redness. You may also have other tests, including:  Examination of discharge from the penis.  Urine tests for infections, such as STDs.  Your health care provider may test you for other STDs, including HIV. How is this treated? Treatment for this condition depends on the cause. If your condition is caused by a bacterial infection, oral antibiotic medicine may be prescribed. If the bacterial infection has spread to your blood, you may need to receive IV antibiotics. Nonbacterial epididymitis is treated with home care that includes bed rest and elevation of the scrotum. Surgery may be needed to treat:  Bacterial epididymitis that causes pus to build up in the scrotum (abscess).  Chronic epididymitis that has not responded to other treatments.  Follow these instructions at home: Medicines  Take over-the-counter and prescription medicines only as told by your health care provider.  If you were prescribed an antibiotic medicine, take it as told by your health care provider. Do not stop taking the antibiotic even if your condition improves. Sexual Activity  If your epididymitis was caused by an STD, avoid sexual activity until your treatment is complete.  Inform your sexual partner or partners if you test positive for an  STD. They may need to be treated.Do not engage in sexual activity with your partner or partners until their treatment is completed. General instructions  Return to your normal activities as told by your health care provider. Ask your health care  provider what activities are safe for you.  Keep your scrotum elevated and supported while resting. Ask your health care provider if you should wear a scrotal support, such as a jockstrap. Wear it as told by your health care provider.  If directed, apply ice to the affected area: ? Put ice in a plastic bag. ? Place a towel between your skin and the bag. ? Leave the ice on for 20 minutes, 2-3 times per day.  Try taking a sitz bath to help with discomfort. This is a warm water bath that is taken while you are sitting down. The water should only come up to your hips and should cover your buttocks. Do this 3-4 times per day or as told by your health care provider.  Keep all follow-up visits as told by your health care provider. This is important. Contact a health care provider if:  You have a fever.  Your pain medicine is not helping.  Your pain is getting worse.  Your symptoms do not improve within three days. This information is not intended to replace advice given to you by your health care provider. Make sure you discuss any questions you have with your health care provider. Document Released: 04/03/2000 Document Revised: 09/12/2015 Document Reviewed: 08/22/2014 Elsevier Interactive Patient Education  2018 [employer].

## 2017-05-24 NOTE — Progress Notes (Signed)
Date:  05/24/2017   Name:  Juan Adkins   DOB:  [birth year]   MRN:  [id number]   Chief Complaint: Establish Care; Back Incision; and Testicle Pain (Left testicle swelling every other day. Nodule still there. Started as size of grape and now shrunk. But still very painful and swelling every other day. This all started when had back surgery and when they removed catheter. )  Testicle Pain  The patient's primary symptoms include scrotal swelling and testicular pain. This is a new problem. The current episode started 1 to 4 weeks ago. The problem occurs every several days. Pertinent negatives include no abdominal pain, chest pain, chills, fever or shortness of breath.  Diabetes  He presents for his follow-up diabetic visit. He has type 2 diabetes mellitus. His disease course has been fluctuating. Pertinent negatives for diabetes include no chest pain and no fatigue. Current diabetic treatment includes oral agent (triple therapy) and insulin injections. He is compliant with treatment most of the time. An ACE inhibitor/angiotensin II receptor blocker is being taken. Eye exam is not current.   Urinary retention/BPH - He had urinary retention after back surgery done 04/26/17.  He was seen by Urology at [facility] 05/05/17 with a planned voiding trial. The catheter was removed on 05/06/17 and he passed a voiding trial the next day.  He was noted to have enlarged prostate. He has not seen a Urologist here in [place].  He continues to have testicular pain and swelling on the left, despite a course of Levaquin and Keflex.  [place] was done that showed epididymo orchitis.  Back Surgery - he was in [place] over [place] Years and had a ruptured disc.  He underwent semi-urgent surgery at [facility].  He immediately re-reruptured and had to have another surgery.  Dilated Cardiomyopathy - likely due to viral infection from 2014. He is maintained on medication.  Post op he had some palpitations so an external defibrillator was ordered which he  is wearing.  He was seen by Cardiology - they recommended to continue to wear the vest and consult specialist to discuss AICD.  Review of Systems  Constitutional: Negative for chills, fatigue and fever.  Respiratory: Negative for chest tightness, shortness of breath and wheezing.   Cardiovascular: Negative for chest pain, palpitations and leg swelling.  Gastrointestinal: Negative for abdominal pain.  Genitourinary: Positive for scrotal swelling and testicular pain.    Patient Active Problem List   Diagnosis Date Noted  . Hyperlipidemia due to type 2 diabetes mellitus ([place]) 03/05/2017  . Hypertension associated with diabetes ([place]) 03/05/2017  . Type 2 diabetes mellitus with diabetic polyneuropathy, with long-term current use of insulin ([place]) 03/05/2017  . Lumbar radiculopathy 02/25/2017  . Lumbar degenerative disc disease 02/25/2017  . Lumbar pain 02/25/2017  . Failed back syndrome of lumbar spine 02/25/2017  . Dilated cardiomyopathy ([place]) 01/18/2017    Prior to Admission medications   Medication Sig Start Date End Date Taking? Authorizing Provider  acetaminophen (TYLENOL) 325 MG tablet Take 650 mg by mouth every 6 (six) hours as needed.   Yes [provider]  atorvastatin (LIPITOR) 80 MG tablet Take 10 mg by mouth daily.  01/18/17  Yes [provider]  carvedilol (COREG) 6.25 MG tablet Take 12.5 mg by mouth 2 (two) times daily with a meal.    Yes [provider]  cholecalciferol (VITAMIN D) 1000 units tablet Take 1,000 Units by mouth daily.   Yes [provider]  digoxin (LANOXIN) 0.125 MG tablet  Take 100 mcg by mouth daily.    Yes [provider]  furosemide (LASIX) 20 MG tablet Take 10 mg by mouth daily.    Yes [provider]  glimepiride (AMARYL) 4 MG tablet Take 4 mg by mouth 2 (two) times daily.    Yes [provider]  HYDROcodone-acetaminophen (NORCO/VICODIN) 5-325 MG tablet Take 1 tablet by mouth every 6 (six) hours  as needed for moderate pain. 12/31/16  Yes [name], PA-C  Insulin Glargine (BASAGLAR KWIKPEN) 100 UNIT/ML SOPN Inject 50 Units into the skin at bedtime. 04/07/17  Yes [provider]  JARDIANCE 25 MG TABS tablet Take 10 mg by mouth daily.  04/06/17  Yes [provider]  lisinopril (PRINIVIL,ZESTRIL) 5 MG tablet Take 5 mg by mouth daily.   Yes [provider]  metFORMIN (GLUCOPHAGE) 1000 MG tablet Take by mouth 2 (two) times daily.  01/18/17  Yes [provider]  Omega-3 Fatty Acids (FISH OIL) 1000 MG CAPS Take 1 capsule by mouth daily.   Yes [provider]  potassium chloride SA (K-DUR,KLOR-CON) 20 MEQ tablet Take 20 mEq by mouth daily.   Yes [provider]    Allergies  Allergen Reactions  . Penicillins Other (See Comments)    Unknown reaction- had reaction as child and teen    Past Surgical History:  Procedure Laterality Date  . LUMBAR LAMINECTOMY  04/26/2017   L3-4 hemilaminectomy and diskectomy (04/23/17 and again 04/26/17    Social History   Tobacco Use  . Smoking status: Former Smoker    Packs/day: 1.00    Years: 30.00    Pack years: 30.00    Types: Cigarettes    Last attempt to quit: 04/19/2013    Years since quitting: 4.0  . Smokeless tobacco: Never Used  Substance Use Topics  . Alcohol use: Yes    Comment: occasional- rare  . Drug use: No     Medication list has been reviewed and updated.  PHQ 2/9 Scores 02/25/2017 02/10/2017 01/26/2017 01/11/2017  PHQ - 2 Score 0 0 0 0    Physical Exam  Constitutional: He is oriented to person, place, and time. He appears well-developed. No distress.  HENT:  Head: Normocephalic and atraumatic.  Cardiovascular: Normal rate, regular rhythm and normal heart sounds. Exam reveals no gallop and no friction rub.  No murmur heard. Pulmonary/Chest: Effort normal. No respiratory distress.  Abdominal: Hernia confirmed negative in the right inguinal area and confirmed negative in  the left inguinal area.  Genitourinary: Penis normal.    Right testis shows no mass and no tenderness. Left testis shows mass, swelling and tenderness. Circumcised.  Musculoskeletal: Normal range of motion. He exhibits no edema or tenderness.       Arms: Lymphadenopathy:       Right: No inguinal adenopathy present.       Left: No inguinal adenopathy present.  Neurological: He is alert and oriented to person, place, and time.  Skin: Skin is warm and dry. No rash noted.  Psychiatric: He has a normal mood and affect. His behavior is normal. Thought content normal.  Nursing note and vitals reviewed.   BP 100/66   Pulse (!) 103   Ht 5\' 9"  (1.753 m)   Wt 219 lb (99.3 kg) Comment: patient wearing heavy boots  SpO2 95%   BMI 32.34 kg/m   Assessment and Plan: 1. Dilated cardiomyopathy ([place]) Seen by Dr. Ubaldo Glassing Referred for consideration of AICD  2. Type 2 diabetes mellitus with  diabetic polyneuropathy, with long-term current use of insulin ([facility]) Continue current medications except cut metformin in half to see if the diarrhea improves  3. Enlarged prostate Needs Urology evaluation Continue meds - Ambulatory referral to Urology  4. Epididymoorchitis - sulfamethoxazole-trimethoprim (BACTRIM DS,SEPTRA DS) 800-160 MG tablet; Take 1 tablet by mouth 2 (two) times daily for 10 days.  Dispense: 20 tablet; Refill: 0 - Ambulatory referral to Urology  5. Lumbar radiculopathy Doing well s/p surgery   Meds ordered this encounter  Medications  . sulfamethoxazole-trimethoprim (BACTRIM DS,SEPTRA DS) 800-160 MG tablet    Sig: Take 1 tablet by mouth 2 (two) times daily for 10 days.    Dispense:  20 tablet    Refill:  0    Partially dictated using Editor, commissioning. Any errors are unintentional.  Halina Maidens, MD [facility]  05/24/2017

## 2017-06-04 LAB — HEMOGLOBIN A1C: HEMOGLOBIN A1C: 8.2

## 2017-06-15 ENCOUNTER — Encounter: Payer: Self-pay | Admitting: Urology

## 2017-06-15 ENCOUNTER — Ambulatory Visit: Payer: 59 | Admitting: Urology

## 2017-06-15 VITALS — BP 85/51 | HR 88 | Ht 69.0 in | Wt 213.0 lb

## 2017-06-15 DIAGNOSIS — N451 Epididymitis: Secondary | ICD-10-CM

## 2017-06-15 DIAGNOSIS — Z87898 Personal history of other specified conditions: Secondary | ICD-10-CM | POA: Diagnosis not present

## 2017-06-15 DIAGNOSIS — N2889 Other specified disorders of kidney and ureter: Secondary | ICD-10-CM | POA: Diagnosis not present

## 2017-06-15 DIAGNOSIS — N400 Enlarged prostate without lower urinary tract symptoms: Secondary | ICD-10-CM

## 2017-06-15 LAB — URINALYSIS, COMPLETE
Bilirubin, UA: NEGATIVE
LEUKOCYTES UA: NEGATIVE
Nitrite, UA: NEGATIVE
Protein, UA: NEGATIVE
RBC, UA: NEGATIVE
Specific Gravity, UA: 1.01 (ref 1.005–1.030)
Urobilinogen, Ur: 0.2 mg/dL (ref 0.2–1.0)
pH, UA: 5.5 (ref 5.0–7.5)

## 2017-06-15 LAB — BLADDER SCAN AMB NON-IMAGING

## 2017-06-15 NOTE — Progress Notes (Signed)
06/15/2017 5:07 PM   Juan Adkins [birth year] [id number]  Referring provider: Glean Hess, MD [address] [place], [place] [address]  Chief Complaint  Patient presents with  . Benign Prostatic Hypertrophy    New Patient    HPI: 57 year old male who presents today for multiple GU issues.  He and his wife were previously living in [place] and is since moved to [place] to be closer to grandchildren.  His grandchildren have subsequently moved to [place].  Over the holidays, went back to [facility] over new years and unfortunately developed a ruptured disc (L3/L4).  He underwent back surgery (laminectomy) on 04/26/2017 and developed postoperative urinary retention.  A Foley catheter was placed shortly after surgery when he was unable to void.  This was subsequently removed.  Unfortunately 3 days after his initial surgery, he returned back to the operating room and the catheter was replaced.  He reports that this was somewhat traumatic with a blood at the time of replacement.    He was seen and evaluated by urology at [facility] and underwent a voiding trial on 05/06/2017.  Since then, he has been able to void spontaneously.  He was also diagnosed and treated for left epididymoorchitis.  He underwent an ultrasound on 04/30/2017 at [facility] with which he brings the diagnostic report with him.  The tail of the left epididymis was noted to be enlarged with increased vascular flow both within the tail and within the testicle itself.  Right testicle was otherwise unremarkable.  Findings were felt to be consistent with left epididymal orchitis.  Overall, his left testicle is improving.  He was treated with a second course of antibiotics by his primary care physician over the past 2 weeks, he has noted dramatic improvement in the pain and swelling in his left testicle.  He is no longer on antibiotics.  His voiding symptoms today have returned back to baseline.  He has no frequency, urgency,  feels like he empties his bladder completely.  Postvoid residual today is minimal, 17 cc.  He takes no medications for his prostate at baseline.  He does have a renal mass previously followed by Dr. Linna Darner in [place].  He was on q6 months surviellence.  Records for this have been requested today.  He has no imaging in our system for review.  He does get up at night to void 1x secondary to furosemide.  He is avoids drinking .  He does has sleep apnea and does not wear his CPAP any further.    PMH: Past Medical History:  Diagnosis Date  . Back pain   . Cancer [facility])    tumor on kidney - unknown side  . CHF (congestive heart failure) ([place])   . Diabetes mellitus without complication ([place])   . Foot drop, left foot   . Hyperlipidemia   . Sleep apnea     Surgical History: Past Surgical History:  Procedure Laterality Date  . LUMBAR LAMINECTOMY  04/26/2017   L3-4 hemilaminectomy and diskectomy (04/23/17 and again 04/26/17    Home Medications:  Allergies as of 06/15/2017      Reactions   Penicillins Other (See Comments)   Unknown reaction- had reaction as child and teen      Medication List        Accurate as of 06/15/17  5:07 PM. Always use your most recent med list.          acetaminophen 325 MG tablet Commonly known as:  TYLENOL Take  650 mg by mouth every 6 (six) hours as needed.   atorvastatin 80 MG tablet Commonly known as:  LIPITOR Take 10 mg by mouth daily.   BASAGLAR KWIKPEN 100 UNIT/ML Sopn Inject 50 Units into the skin at bedtime.   carvedilol 6.25 MG tablet Commonly known as:  COREG Take 12.5 mg by mouth 2 (two) times daily with a meal.   cholecalciferol 1000 units tablet Commonly known as:  VITAMIN D Take 1,000 Units by mouth daily.   digoxin 0.125 MG tablet Commonly known as:  LANOXIN Take 100 mcg by mouth daily.   Fish Oil 1000 MG Caps Take 1 capsule by mouth daily.   furosemide 20 MG tablet Commonly known as:  LASIX Take 10 mg by mouth daily.     glimepiride 4 MG tablet Commonly known as:  AMARYL Take 4 mg by mouth 2 (two) times daily.   JARDIANCE 25 MG Tabs tablet Generic drug:  empagliflozin Take 10 mg by mouth daily.   lisinopril 5 MG tablet Commonly known as:  PRINIVIL,ZESTRIL Take 5 mg by mouth daily.   metFORMIN 1000 MG tablet Commonly known as:  GLUCOPHAGE Take by mouth 2 (two) times daily.   potassium chloride SA 20 MEQ tablet Commonly known as:  K-DUR,KLOR-CON Take 20 mEq by mouth daily.       Allergies:  Allergies  Allergen Reactions  . Penicillins Other (See Comments)    Unknown reaction- had reaction as child and teen    Family History: Family History  Problem Relation Age of Onset  . Dementia Mother   . Heart disease Father   . Diabetes Sister   . Diabetes Brother   . Early death Paternal 77   . Early death Paternal Uncle     Social History:  reports that he quit smoking about 4 years ago. His smoking use included cigarettes. He has a 30.00 pack-year smoking history. he has never used smokeless tobacco. He reports that he drinks alcohol. He reports that he does not use drugs.  ROS: UROLOGY Frequent Urination?: Yes Hard to postpone urination?: No Burning/pain with urination?: No Get up at night to urinate?: Yes Leakage of urine?: No Urine stream starts and stops?: No Trouble starting stream?: No Do you have to strain to urinate?: No Blood in urine?: No Urinary tract infection?: No Sexually transmitted disease?: No Injury to kidneys or bladder?: No Painful intercourse?: No Weak stream?: No Erection problems?: No Penile pain?: No  Gastrointestinal Nausea?: No Vomiting?: No Indigestion/heartburn?: Yes Diarrhea?: Yes Constipation?: Yes  Constitutional Fever: No Night sweats?: No Weight loss?: No Fatigue?: Yes  Skin Skin rash/lesions?: No Itching?: No  Eyes Blurred vision?: No Double vision?: No  Ears/Nose/Throat Sore throat?: No Sinus problems?:  No  Hematologic/Lymphatic Swollen glands?: No Easy bruising?: No  Cardiovascular Leg swelling?: Yes Chest pain?: No  Respiratory Cough?: No Shortness of breath?: Yes  Endocrine Excessive thirst?: Yes  Musculoskeletal Back pain?: Yes Joint pain?: Yes  Neurological Headaches?: No Dizziness?: Yes  Psychologic Depression?: No Anxiety?: No  Physical Exam: BP (!) 85/51   Pulse 88   Ht 5\' 9"  (1.753 m)   Wt 213 lb (96.6 kg)   BMI 31.45 kg/m   Constitutional:  Alert and oriented, No acute distress.  Accompanied by wife today. HEENT: [place] AT, moist mucus membranes.  Trachea midline, no masses. Cardiovascular: No clubbing, cyanosis, or edema. Respiratory: Normal respiratory effort, no increased work of breathing. GI: Abdomen is soft, nontender, nondistended, no abdominal masses GU: Circumcised phallus  with orthotopic meatus widely patent.  Bilateral descended testicles, right testicle normal.  Left testicle minimally enlarged with slight enlargement of the epididymis but otherwise nontender.  No overlying skin changes or scrotal edema. Skin: No rashes, bruises or suspicious lesions. Neurologic: Grossly intact, no focal deficits, moving all 4 extremities. Psychiatric: Normal mood and affect.  Laboratory Data: Lab Results  Component Value Date   WBC 11.0 04/30/2017   HGB 13.2 (A) 04/30/2017   HCT 39 (A) 04/30/2017   MCV 86.1 12/31/2016   PLT 164 04/30/2017    Lab Results  Component Value Date   CREATININE 0.8 04/30/2017     Lab Results  Component Value Date   HGBA1C 12.4 03/05/2017    Urinalysis Lab Results  Component Value Date   SPECGRAV 1.010 06/15/2017   PHUR 5.5 06/15/2017   COLORU Yellow 06/15/2017   APPEARANCEUR Clear 06/15/2017   LEUKOCYTESUR Negative 06/15/2017   PROTEINUR Negative 06/15/2017   GLUCOSEU 3+ (A) 06/15/2017   KETONESU Trace (A) 06/15/2017   RBCU Negative 06/15/2017   BILIRUBINUR Negative 06/15/2017   UUROB 0.2 06/15/2017    NITRITE Negative 06/15/2017    Pertinent Imaging: Reports from outside hospital reviewed, unable to review actual images.  Assessment & Plan:    1. BPH without urinary obstruction History of acute urinary retention times multiple occasions in the setting of constipation, general anesthesia, narcotics use and underlying BPH No baseline urinary symptoms other than nocturia which is likely medication and OSA related No evidence of ongoing UTI Adequate bladder emptying today We will defer PSA testing today in light of recent infection/catheter - Urinalysis, Complete - BLADDER SCAN AMB NON-IMAGING  2. History of urinary retention As above  3. Renal mass Reports history of small renal mass previously followed by urology Records requested Will follow-up in 3 months with renal ultrasound for further evaluation - US RENAL; Future  4. Left epididymitis Improving No indication for further antibiotics at this time Recommend scrotal support   Return in about 3 months (around 09/12/2017) for RUS (also be sure to sign records release [place] [facility]).  Hollice Espy, MD  [facility] [address], [place], [place] [address] [phone]

## 2017-06-16 ENCOUNTER — Encounter: Payer: Self-pay | Admitting: Internal Medicine

## 2017-06-16 DIAGNOSIS — N2889 Other specified disorders of kidney and ureter: Secondary | ICD-10-CM | POA: Insufficient documentation

## 2017-06-16 DIAGNOSIS — G4733 Obstructive sleep apnea (adult) (pediatric): Secondary | ICD-10-CM | POA: Insufficient documentation

## 2017-06-18 ENCOUNTER — Ambulatory Visit: Payer: 59 | Admitting: Urology

## 2017-07-05 ENCOUNTER — Other Ambulatory Visit: Payer: Self-pay

## 2017-07-09 LAB — HM DIABETES EYE EXAM

## 2017-07-14 ENCOUNTER — Other Ambulatory Visit: Payer: Self-pay

## 2017-09-14 ENCOUNTER — Ambulatory Visit: Payer: 59

## 2017-09-14 ENCOUNTER — Ambulatory Visit: Payer: 59 | Admitting: Urology

## 2017-09-15 ENCOUNTER — Encounter (INDEPENDENT_AMBULATORY_CARE_PROVIDER_SITE_OTHER): Payer: Self-pay

## 2017-09-15 ENCOUNTER — Ambulatory Visit
Admission: RE | Admit: 2017-09-15 | Discharge: 2017-09-15 | Disposition: A | Discharge status: Home / Self Care | Payer: 59 | Source: Ambulatory Visit | Attending: Urology | Admitting: Urology

## 2017-09-15 DIAGNOSIS — N2889 Other specified disorders of kidney and ureter: Secondary | ICD-10-CM

## 2017-09-17 ENCOUNTER — Other Ambulatory Visit
Admission: RE | Admit: 2017-09-17 | Discharge: 2017-09-17 | Disposition: A | Discharge status: Home / Self Care | Payer: 59 | Source: Ambulatory Visit | Attending: Urology | Admitting: Urology

## 2017-09-17 ENCOUNTER — Ambulatory Visit (INDEPENDENT_AMBULATORY_CARE_PROVIDER_SITE_OTHER): Payer: 59 | Admitting: Urology

## 2017-09-17 ENCOUNTER — Encounter: Payer: Self-pay | Admitting: Urology

## 2017-09-17 VITALS — BP 144/65 | HR 97 | Resp 16 | Ht 69.0 in | Wt 215.0 lb

## 2017-09-17 DIAGNOSIS — Z87898 Personal history of other specified conditions: Secondary | ICD-10-CM

## 2017-09-17 DIAGNOSIS — N2889 Other specified disorders of kidney and ureter: Secondary | ICD-10-CM | POA: Diagnosis not present

## 2017-09-17 NOTE — Progress Notes (Signed)
09/17/2017 3:27 PM   Juan Adkins 05-15-1960 [id number]  Referring provider: Glean Hess, MD [address] [place], [place] [address]  Chief Complaint  Patient presents with  . Follow-up    RUS    HPI: 57 year old male first seen and evaluated by me in 05/2017 transferring care for multiple GU issues.  He has a personal history of urinary retention following laminectomy, epididymoorchitis all of which have completely resolved at this point.  He has no further issues urinating and no further testicular pain or swelling.  He was prescribed finasteride in January 2019 in outside facility and continue this until a few weeks ago when he ran out.  He is no longer taking this medication.  He has very few urinary symptoms today.  PVR last visit was minimal.  In addition to this, he has a remote history of a renal mass which is being followed by an outside urologist.  We have received records dating back to 2014 remained relatively stable in size over several year period of time.  Per report, most recent imaging 08/2015 at which time: Lesion measured 1.3 x 1.6 which was enhancing on the posterior left interpolar aspect of the kidney.  Follow-up renal ultrasound ordered by me on 09/15/2017 shows left solid slightly hyperechoic lesion now measuring 4.1 x 3.4 x 2.7 in the left midpole.  No flank pain or gross hematuria.   PMH: Past Medical History:  Diagnosis Date  . Back pain   . Cancer [facility])    tumor on kidney - unknown side  . CHF (congestive heart failure) ([place])   . Diabetes mellitus without complication ([place])   . Foot drop, left foot   . Hyperlipidemia   . Sleep apnea     Surgical History: Past Surgical History:  Procedure Laterality Date  . LUMBAR LAMINECTOMY  04/26/2017   L3-4 hemilaminectomy and diskectomy (04/23/17 and again 04/26/17    Home Medications:  Allergies as of 09/17/2017      Reactions   Penicillins Other (See Comments)   Unknown reaction- had  reaction as child and teen      Medication List        Accurate as of 09/17/17 11:59 PM. Always use your most recent med list.          acetaminophen 325 MG tablet Commonly known as:  TYLENOL Take 650 mg by mouth every 6 (six) hours as needed.   atorvastatin 80 MG tablet Commonly known as:  LIPITOR Take 10 mg by mouth daily.   BASAGLAR KWIKPEN 100 UNIT/ML Sopn Inject 50 Units into the skin at bedtime.   carvedilol 6.25 MG tablet Commonly known as:  COREG Take 12.5 mg by mouth 2 (two) times daily with a meal.   cholecalciferol 1000 units tablet Commonly known as:  VITAMIN D Take 1,000 Units by mouth daily.   digoxin 0.125 MG tablet Commonly known as:  LANOXIN Take 100 mcg by mouth daily.   Fish Oil 1000 MG Caps Take 1 capsule by mouth daily.   furosemide 20 MG tablet Commonly known as:  LASIX Take 10 mg by mouth daily.   glimepiride 4 MG tablet Commonly known as:  AMARYL Take 4 mg by mouth 2 (two) times daily.   JARDIANCE 25 MG Tabs tablet Generic drug:  empagliflozin Take 10 mg by mouth daily.   lisinopril 5 MG tablet Commonly known as:  PRINIVIL,ZESTRIL Take 5 mg by mouth daily.   metFORMIN 1000 MG tablet Commonly known as:  GLUCOPHAGE Take by mouth 2 (two) times daily.   ONE TOUCH ULTRA TEST test strip Generic drug:  glucose blood   OZEMPIC 0.25 or 0.5 MG/DOSE Sopn Generic drug:  Semaglutide Inject into the skin.   OZEMPIC 0.25 or 0.5 MG/DOSE Sopn Generic drug:  Semaglutide INJECT 0.5 MG SUBCUTANEOUSLY EVERY 7 (SEVEN) DAYS   potassium chloride SA 20 MEQ tablet Commonly known as:  K-DUR,KLOR-CON Take 20 mEq by mouth daily.       Allergies:  Allergies  Allergen Reactions  . Penicillins Other (See Comments)    Unknown reaction- had reaction as child and teen    Family History: Family History  Problem Relation Age of Onset  . Dementia Mother   . Heart disease Father   . Diabetes Sister   . Diabetes Brother   . Early death Paternal  23   . Early death Paternal Uncle     Social History:  reports that he quit smoking about 4 years ago. His smoking use included cigarettes. He has a 30.00 pack-year smoking history. He has never used smokeless tobacco. He reports that he drinks alcohol. He reports that he does not use drugs.  ROS: 12 point ROS negative other than per HPI today  Physical Exam: BP (!) 144/65   Pulse 97   Resp 16   Ht 5\' 9"  (1.753 m)   Wt 215 lb (97.5 kg)   SpO2 96%   BMI 31.75 kg/m   Constitutional:  Alert and oriented, No acute distress. HEENT: [place] AT, moist mucus membranes.  Trachea midline, no masses. Cardiovascular: No clubbing, cyanosis, or edema. Respiratory: Normal respiratory effort, no increased work of breathing. Skin: No rashes, bruises or suspicious lesions. Neurologic: Grossly intact, no focal deficits, moving all 4 extremities. Psychiatric: Normal mood and affect.   *Offered to perform rectal exam today.  Patient notes that  I performed a rectal exam last visit but failed to document it.  He reports that his prostate was minimally enlarged, no nodules, nontender.  Decline repeat exam today.  Laboratory Data: Lab Results  Component Value Date   WBC 11.0 04/30/2017   HGB 13.2 (A) 04/30/2017   HCT 39 (A) 04/30/2017   MCV 86.1 12/31/2016   PLT 164 04/30/2017    Lab Results  Component Value Date   CREATININE 0.8 04/30/2017     Lab Results  Component Value Date   HGBA1C 8.2 06/04/2017    Urinalysis N/a  Pertinent Imaging: Results for orders placed during the hospital encounter of 09/15/17  US RENAL   Narrative CLINICAL DATA:  Renal mass  EXAM: RENAL / URINARY TRACT ULTRASOUND COMPLETE  COMPARISON:  MRI 12/31/2016  FINDINGS: Right Kidney:  Length: 13.4 cm. Echogenicity within normal limits. No mass or hydronephrosis visualized.  Left Kidney:  Length: 13.1 cm. Solid, slightly hyperechoic 4.1 x 3.4 x 2.7 cm mass in the midpole. This measured approximately 1.8  cm on prior lumbar MRI. No hydronephrosis.  Bladder:  Appears normal for degree of bladder distention.  IMPRESSION: Enlarging solid mass in the midpole of the left kidney concerning for renal cell carcinoma.  No hydronephrosis.   Electronically Signed   By: Rolm Baptise M.D.   On: 09/15/2017 12:07    RUS personally reviewed today  Assessment & Plan:    1. Renal mass Enlarging left renal mass on renal ultrasound I have recommended CT abdomen with and without contrast for further characterization of the mass as well as more accurate sizing and location We briefly  discussed that given the fairly significant interval increase in size, treatment at this point is likely warranted.  We discussed alternatives briefly today including surgical excision versus ablative therapy.  We will discuss this further next visit.  He is agreeable this plan. - CT Abd Wo & W Cm; Future  2. History of urinary retention Doing well today without any significant urinary symptoms No longer on finasteride We will repeat PSA prior to next visit, caveat that the patient has been on finasteride for 43-month interval - PSA   Return in about 1 month (around 10/15/2017) for f/u CT scan.  Hollice Espy, MD  [facility] [address], [place], [place] [address] [phone]

## 2017-09-18 LAB — PSA: Prostatic Specific Antigen: 0.38 ng/mL (ref 0.00–4.00)

## 2017-09-20 ENCOUNTER — Telehealth: Payer: Self-pay

## 2017-09-20 NOTE — Telephone Encounter (Signed)
-----   Message from Hollice Espy, MD sent at 09/18/2017  3:43 PM EDT ----- PSA is very low, 0.38, excellent news.  Hollice Espy, MD

## 2017-09-20 NOTE — Telephone Encounter (Signed)
Pt informed

## 2017-10-09 ENCOUNTER — Emergency Department
Admission: EM | Admit: 2017-10-09 | Discharge: 2017-10-18 | Disposition: E | Payer: 59 | Attending: Emergency Medicine | Admitting: Emergency Medicine

## 2017-10-09 DIAGNOSIS — I469 Cardiac arrest, cause unspecified: Secondary | ICD-10-CM

## 2017-10-09 DIAGNOSIS — Z87891 Personal history of nicotine dependence: Secondary | ICD-10-CM | POA: Insufficient documentation

## 2017-10-09 DIAGNOSIS — I10 Essential (primary) hypertension: Secondary | ICD-10-CM | POA: Insufficient documentation

## 2017-10-09 DIAGNOSIS — Z794 Long term (current) use of insulin: Secondary | ICD-10-CM | POA: Diagnosis not present

## 2017-10-09 DIAGNOSIS — E119 Type 2 diabetes mellitus without complications: Secondary | ICD-10-CM | POA: Insufficient documentation

## 2017-10-09 DIAGNOSIS — Z79899 Other long term (current) drug therapy: Secondary | ICD-10-CM | POA: Insufficient documentation

## 2017-10-09 DIAGNOSIS — Z85528 Personal history of other malignant neoplasm of kidney: Secondary | ICD-10-CM | POA: Insufficient documentation

## 2017-10-09 LAB — GLUCOSE, CAPILLARY: GLUCOSE-CAPILLARY: 485 mg/dL — AB (ref 65–99)

## 2017-10-09 MED ORDER — EPINEPHRINE PF 1 MG/ML IJ SOLN
1.0000 mg | Freq: Once | INTRAMUSCULAR | Status: AC
  Administered 2017-10-09: 1 mg via INTRAVENOUS

## 2017-10-09 MED ORDER — MAGNESIUM SULFATE 2 GM/50ML IV SOLN
2.0000 g | Freq: Once | INTRAVENOUS | Status: AC
  Administered 2017-10-09: 2 g via INTRAVENOUS

## 2017-10-09 MED ORDER — SODIUM CHLORIDE 0.9 % IV BOLUS
1000.0000 mL | Freq: Once | INTRAVENOUS | Status: AC
  Administered 2017-10-09: 1000 mL via INTRAVENOUS

## 2017-10-09 MED FILL — Medication: Qty: 1 | Status: AC

## 2017-10-11 ENCOUNTER — Other Ambulatory Visit: Payer: Self-pay

## 2017-10-13 ENCOUNTER — Other Ambulatory Visit: Payer: Self-pay

## 2017-10-13 DIAGNOSIS — Z794 Long term (current) use of insulin: Principal | ICD-10-CM

## 2017-10-13 DIAGNOSIS — E1142 Type 2 diabetes mellitus with diabetic polyneuropathy: Secondary | ICD-10-CM

## 2017-10-13 LAB — HM DIABETES EYE EXAM

## 2017-10-15 ENCOUNTER — Ambulatory Visit: Payer: 59 | Admitting: Urology

## 2017-10-18 NOTE — ED Notes (Signed)
Pads applied

## 2017-10-18 NOTE — ED Notes (Signed)
ED [place] Rump applied

## 2017-10-18 NOTE — ED Notes (Signed)
Saline gauze placed on eyes and taped in place.  Toe tag placed on patient's left great toe.   Patient placed in body bag.

## 2017-10-18 NOTE — ED Notes (Addendum)
CPR paused. Pulse check. No pulses felt.

## 2017-10-18 NOTE — ED Notes (Addendum)
CPR resumed 

## 2017-10-18 NOTE — Progress Notes (Signed)
   October 24, 2017 0106  Clinical Encounter Type  Visited With Family  Visit Type Initial   Chaplain paged to ED for incoming EMS with CPR in progress.  Chaplain arrived before EMS, checked in with unit staff.  When patient arrived, EMS stated family was en route.  Chaplain met wife at ED entrance and took her straight back to family conference room for privacy.  Stayed with wife while doctor gave her the news of her husband being deceased.  She requested that Chaplain accompany her to view her husband's body.  Chaplain provided pastoral presence and emotional support, read scripture and prayed at bedside with wife.  She is unsure of her plans for funeral arrangements, may wish to fly her husband to [place] for burial, will consult other relatives to help her reach a decision.  Wife was alone, so Chaplain and security escorted her to her car when she was ready to leave.

## 2017-10-18 NOTE — ED Notes (Signed)
Patient transported to morgue.

## 2017-10-18 NOTE — ED Provider Notes (Signed)
[facility] Emergency Department Provider Note  ____________________________________________   First MD Initiated Contact with Patient 07-Nov-2017 0134     (approximate)  I have reviewed the triage vital signs and the nursing notes.   HISTORY  Chief Complaint Cardiac Arrest  Level 5 caveat:  history/ROS limited by acute/critical illness  HPI Juan Adkins is a 57 y.o. male with medical history as listed below who presents by EMS as a CPR in progress.  As per the paramedics, he was asleep in bed with his wife when she was awakened with the sounds of his agonal respirations.  He was unresponsive and 911 was called.  They arrived and began CPR at approximately 12:15 AM.  Initially he was in a persistent ventricular fibrillation arrest and he received 13 defibrillating shocks.  He also received 6 rounds of epinephrine 1 mg IV, 1 ampoule of sodium bicarbonate, and amiodarone 300 mg IV push.  At one point they got a spontaneous return of circulation and were in the process of transporting him to the ambulance when he went into PEA arrest.  They resumed CPR and he became asystolic.  When he arrived to the emergency department he was pulseless with a King airway in place and chest compressions in progress by way of the mechanical chest compression device.  See hospital course for additional details.  Past Medical History:  Diagnosis Date  . Back pain   . Cancer [facility])    tumor on kidney - unknown side  . CHF (congestive heart failure) ([place])   . Diabetes mellitus without complication ([place])   . Foot drop, left foot   . Hyperlipidemia   . Sleep apnea     Patient Active Problem List   Diagnosis Date Noted  . OSA (obstructive sleep apnea) 06/16/2017  . Renal mass 06/16/2017  . Enlarged prostate 05/24/2017  . Epididymoorchitis 05/24/2017  . Hyperlipidemia due to type 2 diabetes mellitus ([place]) 03/05/2017  . Hypertension associated with diabetes ([place]) 03/05/2017  . Type  2 diabetes mellitus with diabetic polyneuropathy, with long-term current use of insulin ([place]) 03/05/2017  . Lumbar radiculopathy 02/25/2017  . Lumbar degenerative disc disease 02/25/2017  . Lumbar pain 02/25/2017  . Failed back syndrome of lumbar spine 02/25/2017  . Dilated cardiomyopathy ([place]) 01/18/2017    Past Surgical History:  Procedure Laterality Date  . LUMBAR LAMINECTOMY  04/26/2017   L3-4 hemilaminectomy and diskectomy (04/23/17 and again 04/26/17    Prior to Admission medications   Medication Sig Start Date End Date Taking? Authorizing Provider  acetaminophen (TYLENOL) 325 MG tablet Take 650 mg by mouth every 6 (six) hours as needed.    [provider]  atorvastatin (LIPITOR) 80 MG tablet Take 10 mg by mouth daily.  01/18/17   [provider]  carvedilol (COREG) 6.25 MG tablet Take 12.5 mg by mouth 2 (two) times daily with a meal.     [provider]  cholecalciferol (VITAMIN D) 1000 units tablet Take 1,000 Units by mouth daily.    [provider]  digoxin (LANOXIN) 0.125 MG tablet Take 100 mcg by mouth daily.     [provider]  furosemide (LASIX) 20 MG tablet Take 10 mg by mouth daily.     [provider]  glimepiride (AMARYL) 4 MG tablet Take 4 mg by mouth 2 (two) times daily.     [provider]  Insulin Glargine (BASAGLAR KWIKPEN) 100 UNIT/ML SOPN Inject 50 Units into the skin at bedtime. 04/07/17  [provider]  JARDIANCE 25 MG TABS tablet Take 10 mg by mouth daily.  04/06/17   [provider]  lisinopril (PRINIVIL,ZESTRIL) 5 MG tablet Take 5 mg by mouth daily.    [provider]  metFORMIN (GLUCOPHAGE) 1000 MG tablet Take by mouth 2 (two) times daily.  01/18/17   [provider]  Omega-3 Fatty Acids (FISH OIL) 1000 MG CAPS Take 1 capsule by mouth daily.    [provider]  ONE TOUCH ULTRA TEST test strip  09/15/17   [provider]  OZEMPIC 0.25 or 0.5  MG/DOSE SOPN INJECT 0.5 MG SUBCUTANEOUSLY EVERY 7 (SEVEN) DAYS 08/27/17   [provider]  potassium chloride SA (K-DUR,KLOR-CON) 20 MEQ tablet Take 20 mEq by mouth daily.    [provider]  Semaglutide (OZEMPIC) 0.25 or 0.5 MG/DOSE SOPN Inject into the skin. 08/27/17   [provider]    Allergies Penicillins  Family History  Problem Relation Age of Onset  . Dementia Mother   . Heart disease Father   . Diabetes Sister   . Diabetes Brother   . Early death Paternal 40   . Early death Paternal Uncle     Social History Social History   Tobacco Use  . Smoking status: Former Smoker    Packs/day: 1.00    Years: 30.00    Pack years: 30.00    Types: Cigarettes    Last attempt to quit: 04/19/2013    Years since quitting: 4.4  . Smokeless tobacco: Never Used  Substance Use Topics  . Alcohol use: Yes    Comment: occasional- rare  . Drug use: No    Review of Systems Level 5 caveat:  history/ROS limited by acute/critical illness  ____________________________________________   PHYSICAL EXAM:  VITAL SIGNS: (Blood pressure and pulse rate were obtained while chest compressions were in progress) ED Triage Vitals  Enc Vitals Group     BP 11/01/2017 0123 (!) 85/11     Pulse Rate Nov 01, 2017 0120 100     Resp 11/01/17 0120 (!) 39     Temp --      Temp src --      SpO2 11-01-2017 0120 (!) 69 %     Weight --      Height --      Head Circumference --      Peak Flow --      Pain Score --      Pain Loc --      Pain Edu? --      Excl. in [place]? --    Gen: unresponsive Eyes:  pupils fixed and dilated with no corneal reflex.  Cardiovascular: pulseless  Resp: apneic. Breath sounds equal bilaterally with bagging via the King airway Abd: Mild distention of the abdomen likely secondary to the resuscitative process Neuro: GCS 3, unresponsive to pain  HEENT: The patient has a small laceration on right upper forehead, unclear whether obtained in the process of  resuscitation or were present previously Neck: No crepitus  Musculoskeletal: No deformity  Skin: warm, pallor and peripherally and periorally cyanotic  ___________________________ okay _________________   LABS (all labs ordered are listed, but only abnormal results are displayed)  Labs Reviewed  GLUCOSE, CAPILLARY - Abnormal; Notable for the following components:      Result Value   Glucose-Capillary 485 (*)    All other components within normal limits  CBG MONITORING, ED   ____________________________________________  EKG  ED ECG REPORT I, Hinda Kehr, the  attending physician, personally viewed and interpreted this ECG.  Date: October 29, 2017 EKG Time: 1:22 AM Rate: 148 (I do not believe this is accurate) Rhythm: Wide-complex tachycardia, but of note this EKG was obtained during chest compressions QRS Axis: normal Intervals: Wide-complex tachycardia (during chest compressions) ST/T Wave abnormalities: Global ST segment elevation (EKG obtained during chest compressions Narrative Interpretation: CPR in progress, indicative of acute ischemia   ____________________________________________  RADIOLOGY   ED MD interpretation: No indication for imaging  Official radiology report(s): No results found.  ____________________________________________   PROCEDURES  Critical Care performed: Yes, see critical care procedure note(s)   Procedure(s) performed:   .Critical Care Performed by: Hinda Kehr, MD Authorized by: Hinda Kehr, MD   Critical care provider statement:    Critical care time (minutes):  30   Critical care time was exclusive of:  Separately billable procedures and treating other patients   Critical care was necessary to treat or prevent imminent or life-threatening deterioration of the following conditions:  Circulatory failure, cardiac failure and respiratory failure   Critical care was time spent personally by me on the following activities:   Development of treatment plan with patient or surrogate, discussions with consultants, evaluation of patient's response to treatment, examination of patient, obtaining history from patient or surrogate, ordering and performing treatments and interventions, ordering and review of laboratory studies, ordering and review of radiographic studies, pulse oximetry, re-evaluation of patient's condition and review of old charts     ____________________________________________   INITIAL IMPRESSION / ASSESSMENT AND PLAN / ED COURSE  As part of my medical decision making, I reviewed the following data within the electronic MEDICAL RECORD NUMBER History obtained from family, Nursing notes reviewed and incorporated, EKG interpreted , Old chart reviewed, discussed with medical examiner and Notes from prior ED visits    When patient arrived we put him on the Zoll with anterior and posterior pads and did a rhythm and pulse check.  There was no pulse but he did have an organized bradycardic rhythm on the monitor consistent with PEA arrest.  We continued chest compressions with the LUCAS device and I administered 2 g of magnesium and another milligram of epinephrine.  We continued CPR per ACLS protocol for an additional 2 rounds with no return of spontaneous circulation.  At this point the patient had been down for an hour and 15 minutes and was completely unresponsive.  I did not replace the [facility] airway with an endotracheal tube because I heard good bilateral breath sounds with the [facility] airway in place.  I checked with the ultrasound and the patient had some unorganized cardiac activity but no organized contractions and no evidence of pericardial effusion.  At that point I felt that the resuscitation was futile and I called time of death at 1:31 AM.  Family is not yet available but I will discuss with her whether or not she would want an autopsy, but I do not think it is necessary appropriate based on the history and on the  patient's medical history as listed in his chart.  However we are leaving the airway and IVs in place for the time being.  PCP will be contacted to sign death certificate.    Clinical Course as of Oct 09 316  Sat Oct 09, 2017  0208 Spoke in person with the patient's wife and answered any questions possible.  She was not certain of the name of the primary care doctor but he does go to Dr. Ubaldo Glassing for cardiology.  She is not interested in having an autopsy and I think that is appropriate given his extensive cardiac history and the fact that he was recommended to use a LifeVest just a month ago but he declined it because he felt it was uncomfortable and he went to [place] last week to be evaluated for defibrillator.  I spoke by phone with the medical examiner, Lanny Hurst, who agreed that this does not need to be an ME case.   [CF]    Clinical Course User Index [CF] Hinda Kehr, MD    ____________________________________________  FINAL CLINICAL IMPRESSION(S) / ED DIAGNOSES  Final diagnoses:  Cardiopulmonary arrest [facility])     MEDICATIONS GIVEN DURING THIS VISIT:  Medications  EPINEPHrine (ADRENALIN) 1 mg (1 mg Intravenous Given 10-25-2017 0120)  magnesium sulfate IVPB 2 g 50 mL (0 g Intravenous Stopped 2017-10-25 0131)  EPINEPHrine (ADRENALIN) 1 mg (1 mg Intravenous Given 10-25-17 0125)     ED Discharge Orders    None       Note:  This document was prepared using Dragon voice recognition software and may include unintentional dictation errors.    Hinda Kehr, MD 25-Oct-2017 [phone]

## 2017-10-18 NOTE — ED Notes (Signed)
Chaplain accompanied patient's wife from family consult room to patient room

## 2017-10-18 NOTE — ED Notes (Signed)
Family left bedside.

## 2017-10-18 NOTE — ED Notes (Signed)
No cardiac activity visualized on ultrasound by MD Karma Greaser

## 2017-10-18 NOTE — ED Notes (Signed)
AC at bedside to take patient to morgue.

## 2017-10-18 NOTE — ED Triage Notes (Addendum)
Patient coming [facility] from home, CPR in progress. Per EMS, wife reports she was awoken from sleep due to patient's gurgling and abnormal respirations. Patient's wife was not able to feel pulse, or see respirations and called EMS.   EMS defibrillated patient a total of 13 times, 6 of these shocks where dual sequential shocks. Patient was in vfib for 30 minutes, transitioned into pulseless electrical activity, and then again into asystole.   Patient was given 6 doses of epinephrine, 300 mg Amiodarone, 50 meq Bicarb, 250 mL NaCl, and 2 of narcan by EMS in transport.   EMS reports CBG of 147, BP 74/48.   Linton Rump in place at patient arrival to room.   MD Donita Brooks RN, Lea RN, [facility] RN, Kasey RN, [place] RT, Kristy RT, Carlyle Dolly, Iona Hansen, Alissa NT at bedside on arrival to ED room.

## 2017-10-18 NOTE — ED Notes (Signed)
Time of Death called by MD Karma Greaser at 1:31.

## 2017-10-18 NOTE — ED Notes (Addendum)
CPR paused. MD Karma Greaser at bedside with ultrasound machine in attempt to visualize cardiac activity.

## 2017-10-18 NOTE — ED Notes (Signed)
EMS [place] removed. Compressions begun by ED staff.

## 2017-10-18 NOTE — ED Notes (Signed)
No belongings/clothing brought with patient. Abrasion noted to right forehead. MD and Charge RN informed.

## 2017-10-18 DEATH — deceased

## 2017-10-27 ENCOUNTER — Other Ambulatory Visit: Payer: Self-pay

## 2017-10-27 NOTE — Progress Notes (Unsigned)
Funeral home called concerning pt's death certificate- had to verify tobacco use for [facility]

## 2017-11-19 ENCOUNTER — Ambulatory Visit: Payer: 59 | Admitting: Urology

## 2018-10-05 IMAGING — US US RENAL
1 series · 14 of 25 positions shown · non-contrast
Comparison: MRI 12/31/2016

CLINICAL DATA: Renal mass

EXAM:
RENAL / URINARY TRACT ULTRASOUND COMPLETE

[Series 1: us renal · 0.28mm/px · 14 of 43 slices shown]
[im 1/43]
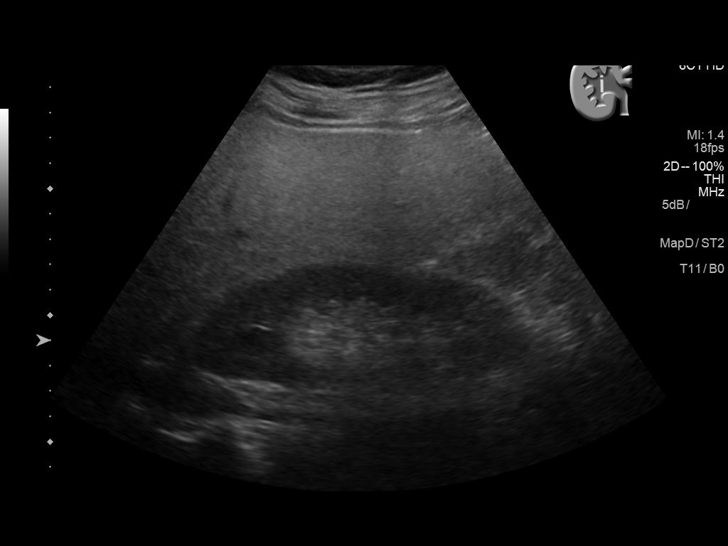
[im 4/43]
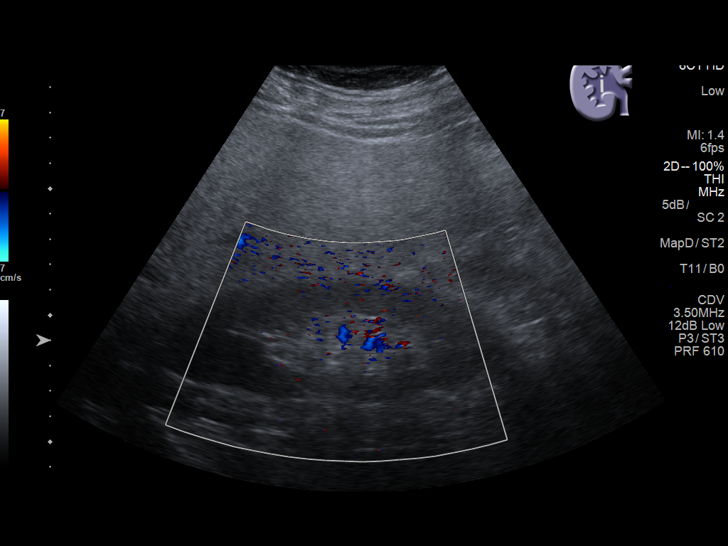
[im 8/43]
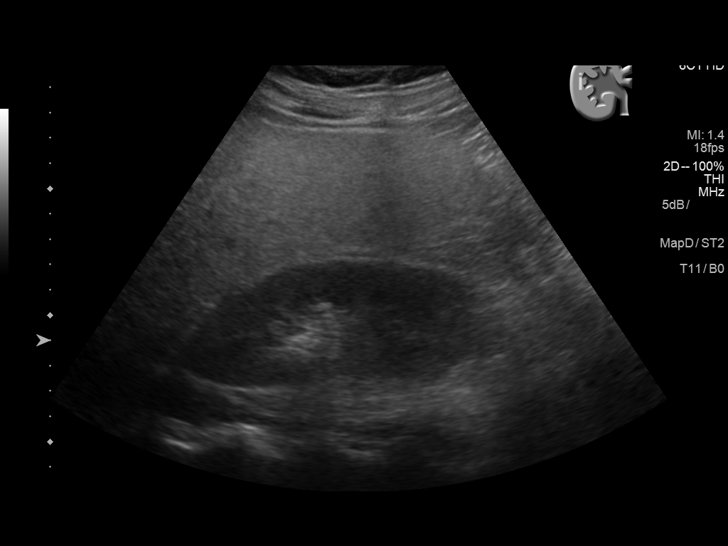
[im 11/43]
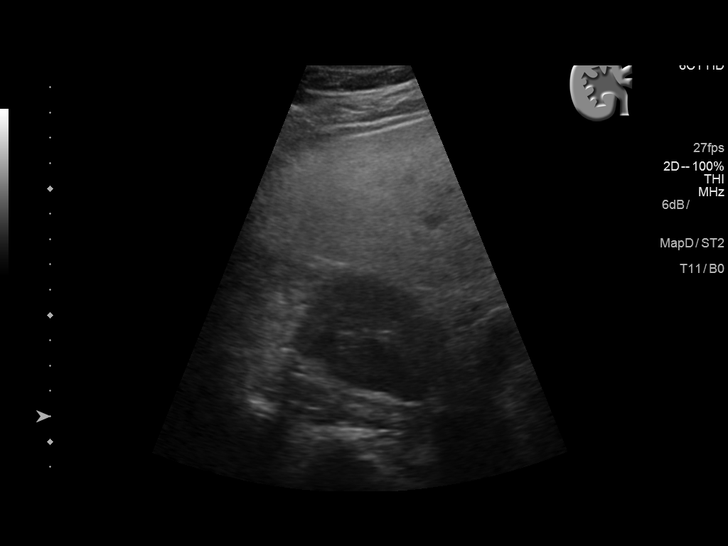
[im 15/43]
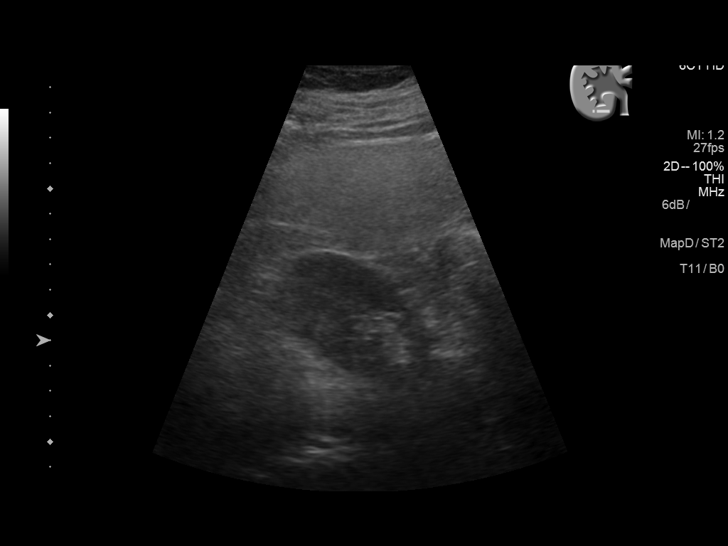
[im 16/43]
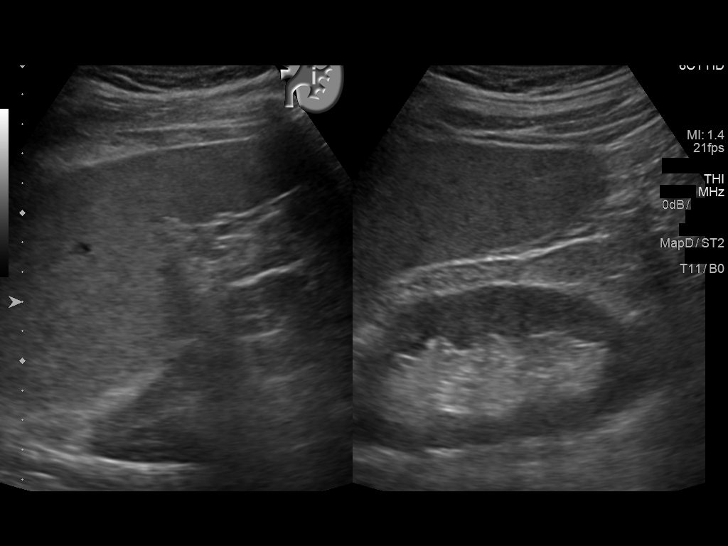
[im 20/43]
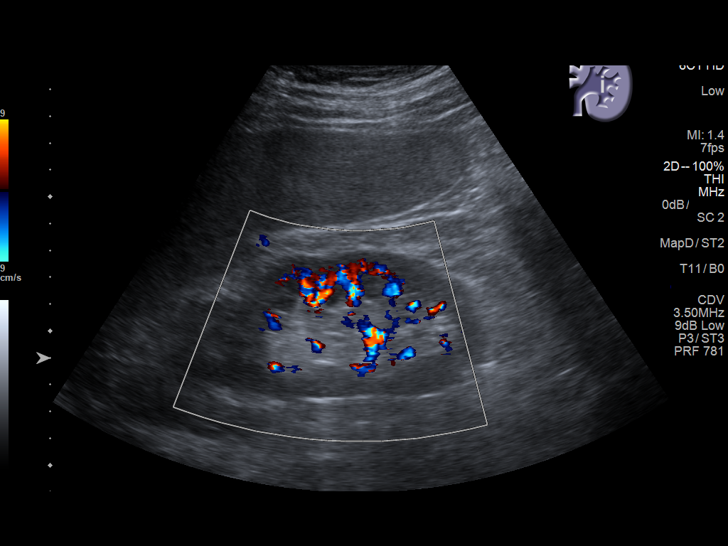
[im 23/43]
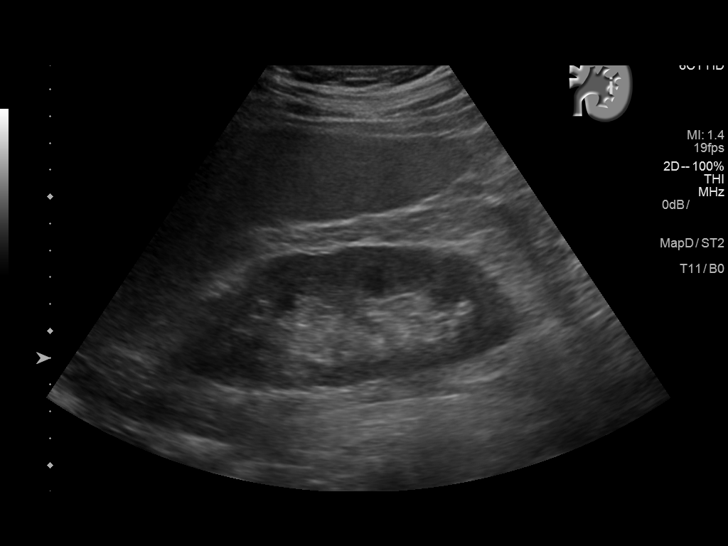
[im 27/43]
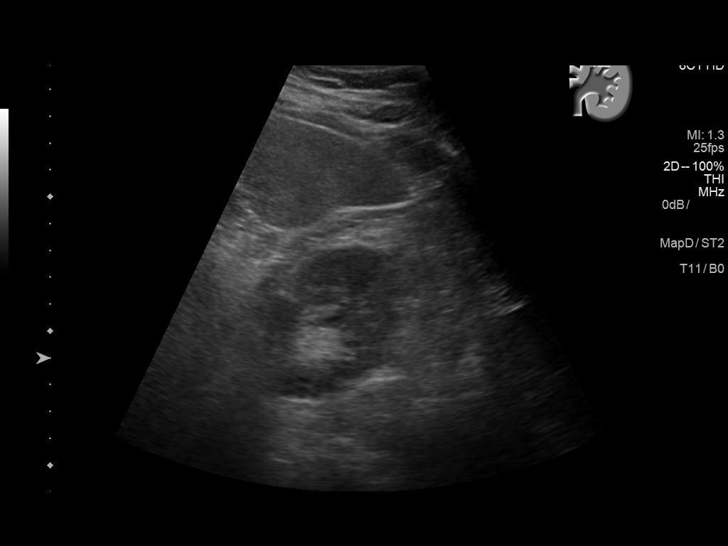
[im 29/43]
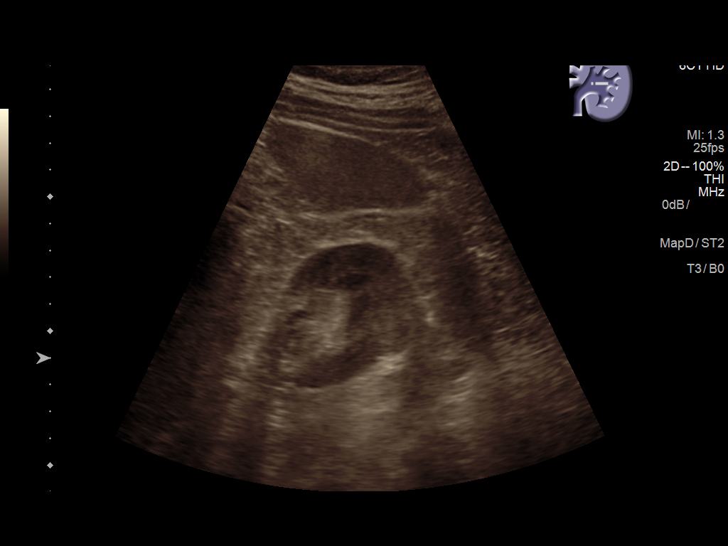
[im 32/43]
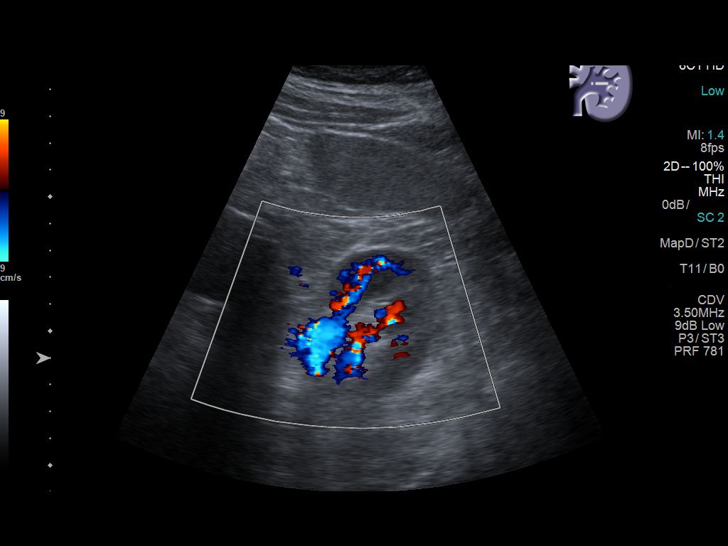
[im 36/43]
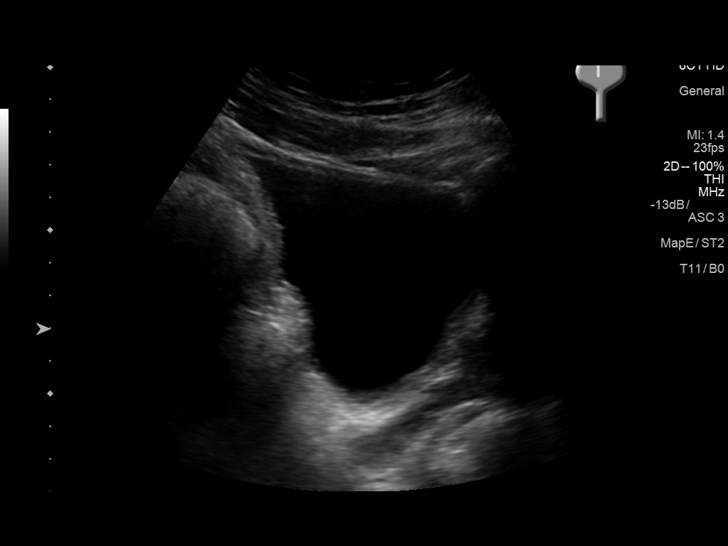
[im 39/43]
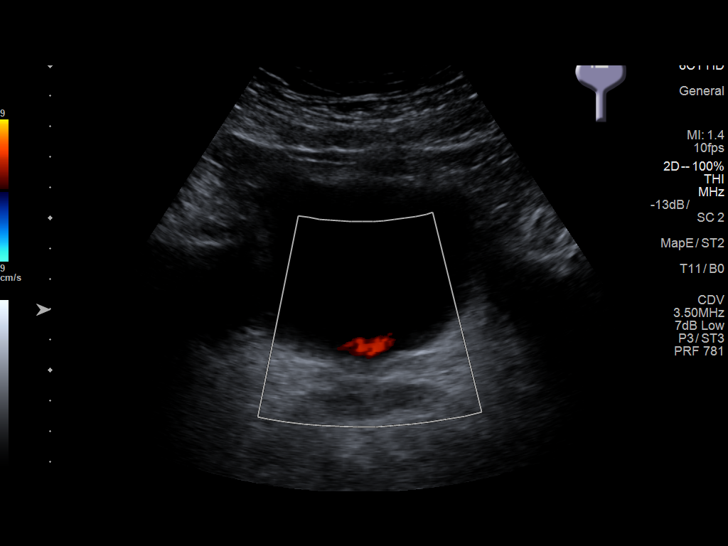
[im 43/43]
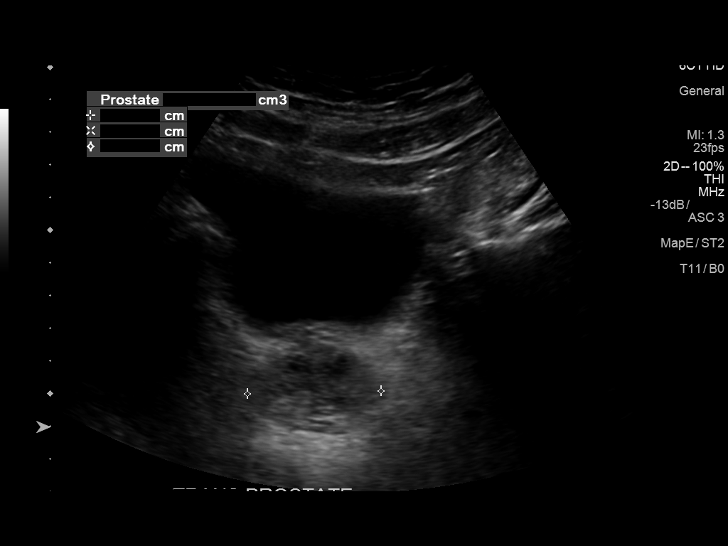

[14 of 25 positions shown; findings below may reference images not displayed]

FINDINGS: Right Kidney:

Length: 13.4 cm. Echogenicity within normal limits. No mass or
hydronephrosis visualized.

Left Kidney:

Length: 13.1 cm. Solid, slightly hyperechoic 4.1 x 3.4 x 2.7 cm mass
in the midpole. This measured approximately 1.8 cm on prior lumbar
MRI. No hydronephrosis.

Bladder:

Appears normal for degree of bladder distention.
IMPRESSION: Enlarging solid mass in the midpole of the left kidney concerning
for renal cell carcinoma.

No hydronephrosis.
